# Patient Record
Sex: Female | Born: 1990 | Race: White | Hispanic: No | Marital: Married | Smoking: Former smoker
Health system: Southern US, Community
[De-identification: ages and names within clinical notes are randomized; demographics above are authoritative.]

## PROBLEM LIST (undated history)

## (undated) ENCOUNTER — Inpatient Hospital Stay (HOSPITAL_COMMUNITY): Payer: Self-pay

## (undated) DIAGNOSIS — O24419 Gestational diabetes mellitus in pregnancy, unspecified control: Secondary | ICD-10-CM

## (undated) DIAGNOSIS — T7840XA Allergy, unspecified, initial encounter: Secondary | ICD-10-CM

## (undated) DIAGNOSIS — F419 Anxiety disorder, unspecified: Secondary | ICD-10-CM

## (undated) HISTORY — PX: TONSILECTOMY/ADENOIDECTOMY WITH MYRINGOTOMY: SHX6125

## (undated) HISTORY — DX: Allergy, unspecified, initial encounter: T78.40XA

---
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
## 2012-01-28 ENCOUNTER — Ambulatory Visit (INDEPENDENT_AMBULATORY_CARE_PROVIDER_SITE_OTHER): Payer: BC Managed Care – PPO | Admitting: Internal Medicine

## 2012-01-28 VITALS — BP 122/83 | HR 69 | Temp 98.1°F | Resp 18 | Ht 63.0 in | Wt 140.0 lb

## 2012-01-28 DIAGNOSIS — R059 Cough, unspecified: Secondary | ICD-10-CM

## 2012-01-28 DIAGNOSIS — F419 Anxiety disorder, unspecified: Secondary | ICD-10-CM

## 2012-01-28 DIAGNOSIS — R05 Cough: Secondary | ICD-10-CM

## 2012-01-28 DIAGNOSIS — J029 Acute pharyngitis, unspecified: Secondary | ICD-10-CM

## 2012-01-28 LAB — POCT RAPID STREP A (OFFICE): Rapid Strep A Screen: NEGATIVE

## 2012-01-28 MED ORDER — HYDROCODONE-ACETAMINOPHEN 7.5-500 MG/15ML PO SOLN: 5.0000 mL | Freq: Four times a day (QID) | ORAL | Status: AC | PRN

## 2012-01-28 MED ORDER — AZITHROMYCIN 250 MG PO TABS: ORAL_TABLET | ORAL | Status: AC

## 2012-01-28 NOTE — Progress Notes (Signed)
  Subjective:    Patient ID: Jessica Rhodes, female    DOB: Mar 20, 1991, 21 y.o.   MRN: [id number]  HPI Very ST, cough for 3d No sob, cp   Review of Systems     Objective:   Physical Exam Throat red , no tonsills Lungs clear  Results for orders placed in visit on 01/28/12  POCT RAPID STREP A (OFFICE)      Component Value Range   Rapid Strep A Screen Negative  Negative         Assessment & Plan:  Zpak and lortab elixir

## 2012-01-28 NOTE — Patient Instructions (Signed)

## 2013-01-20 ENCOUNTER — Ambulatory Visit (INDEPENDENT_AMBULATORY_CARE_PROVIDER_SITE_OTHER): Payer: BC Managed Care – PPO | Admitting: Internal Medicine

## 2013-01-20 VITALS — BP 116/80 | HR 80 | Temp 99.9°F | Resp 16 | Ht 63.0 in | Wt 129.0 lb

## 2013-01-20 DIAGNOSIS — J309 Allergic rhinitis, unspecified: Secondary | ICD-10-CM

## 2013-01-20 DIAGNOSIS — R059 Cough, unspecified: Secondary | ICD-10-CM

## 2013-01-20 DIAGNOSIS — J301 Allergic rhinitis due to pollen: Secondary | ICD-10-CM

## 2013-01-20 DIAGNOSIS — R05 Cough: Secondary | ICD-10-CM

## 2013-01-20 DIAGNOSIS — J019 Acute sinusitis, unspecified: Secondary | ICD-10-CM

## 2013-01-20 MED ORDER — FLUTICASONE PROPIONATE 50 MCG/ACT NA SUSP: NASAL | Status: DC

## 2013-01-20 MED ORDER — AMOXICILLIN 875 MG PO TABS: 875.0000 mg | ORAL_TABLET | Freq: Two times a day (BID) | ORAL | Status: DC

## 2013-01-20 MED ORDER — HYDROCODONE-HOMATROPINE 5-1.5 MG/5ML PO SYRP: 5.0000 mL | ORAL_SOLUTION | Freq: Four times a day (QID) | ORAL | Status: DC | PRN

## 2013-01-20 NOTE — Progress Notes (Signed)
  Subjective:    Patient ID: Jessica Rhodes, female    DOB: February 25, 1991, 22 y.o.   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 amoxicillin (AMOXIL) 875 MG tablet    Sig: Take 1 tablet (875 mg total) by mouth 2 (two) times daily.    Dispense:  20 tablet    Refill:  0  . HYDROcodone-homatropine (HYCODAN) 5-1.5 MG/5ML syrup    Sig: Take 5 mLs by mouth every 6 (six) hours as needed for cough.    Dispense:  120 mL    Refill:  0  . fluticasone (FLONASE) 50 MCG/ACT nasal spray    Sig: 1 spr each nostr bid    Dispense:  16 g    Refill:  6

## 2014-09-11 HISTORY — PX: WISDOM TOOTH EXTRACTION: SHX21

## 2016-08-10 ENCOUNTER — Encounter: Payer: Self-pay | Admitting: Family Medicine

## 2016-08-10 ENCOUNTER — Ambulatory Visit (INDEPENDENT_AMBULATORY_CARE_PROVIDER_SITE_OTHER): Payer: PRIVATE HEALTH INSURANCE | Admitting: Family Medicine

## 2016-08-10 VITALS — BP 118/70 | HR 83 | Temp 98.6°F | Resp 18 | Ht 63.0 in | Wt 158.2 lb

## 2016-08-10 DIAGNOSIS — J069 Acute upper respiratory infection, unspecified: Secondary | ICD-10-CM

## 2016-08-10 DIAGNOSIS — J301 Allergic rhinitis due to pollen: Secondary | ICD-10-CM

## 2016-08-10 DIAGNOSIS — R0981 Nasal congestion: Secondary | ICD-10-CM

## 2016-08-10 MED ORDER — FLUTICASONE PROPIONATE 50 MCG/ACT NA SUSP: NASAL | 6 refills | Status: DC

## 2016-08-10 NOTE — Patient Instructions (Addendum)
IF you received an x-ray today, you will receive an invoice from [facility]. Please contact [facility] at [phone] with questions or concerns regarding your invoice.   IF you received labwork today, you will receive an invoice from [facility]. Please contact [facility] at [phone] with questions or concerns regarding your invoice.   Our billing staff will not be able to assist you with questions regarding bills from these companies.  You will be contacted with the lab results as soon as they are available. The fastest way to get your results is to activate your My Chart account. Instructions are located on the last page of this paperwork. If you have not heard from us regarding the results in 2 weeks, please contact this office.     Viral Respiratory Infection Introduction A viral respiratory infection is an illness that affects parts of the body used for breathing, like the lungs, nose, and throat. It is caused by a germ called a virus. Some examples of this kind of infection are:  A cold.  The flu (influenza).  A respiratory syncytial virus (RSV) infection. How do I know if I have this infection? Most of the time this infection causes:  A stuffy or runny nose.  Yellow or green fluid in the nose.  A cough.  Sneezing.  Tiredness (fatigue).  Achy muscles.  A sore throat.  Sweating or chills.  A fever.  A headache. How is this infection treated? If the flu is diagnosed early, it may be treated with an antiviral medicine. This medicine shortens the length of time a person has symptoms. Symptoms may be treated with over-the-counter and prescription medicines, such as:  Expectorants. These make it easier to cough up mucus.  Decongestant nasal sprays. Doctors do not prescribe antibiotic medicines for viral infections. They do not work with this kind of infection. How do I know if I should stay home? To keep  others from getting sick, stay home if you have:  A fever.  A lasting cough.  A sore throat.  A runny nose.  Sneezing.  Muscles aches.  Headaches.  Tiredness.  Weakness.  Chills.  Sweating.  An upset stomach (nausea). Follow these instructions at home:  Rest as much as possible.  Take over-the-counter and prescription medicines only as told by your doctor.  Drink enough fluid to keep your pee (urine) clear or pale yellow.  Gargle with salt water. Do this 3-4 times per day or as needed. To make a salt-water mixture, dissolve -1 tsp of salt in 1 cup of warm water. Make sure the salt dissolves all the way.  Use nose drops made from salt water. This helps with stuffiness (congestion). It also helps soften the skin around your nose.  Do not drink alcohol.  Do not use tobacco products, including cigarettes, chewing tobacco, and e-cigarettes. If you need help quitting, ask your doctor. Get help if:  Your symptoms last for 10 days or longer.  Your symptoms get worse over time.  You have a fever.  You have very bad pain in your face or forehead.  Parts of your jaw or neck become very swollen. Get help right away if:  You feel pain or pressure in your chest.  You have shortness of breath.  You faint or feel like you will faint.  You keep throwing up (vomiting).  You feel confused. This information is not intended to replace advice given to you by your health care provider.  Make sure you discuss any questions you have with your health care provider. Document Released: 08/10/2008 Document Revised: 02/03/2016 Document Reviewed: 02/03/2015  2017 Elsevier

## 2016-08-10 NOTE — Progress Notes (Signed)
  Chief Complaint  Patient presents with  . Nasal Congestion  . 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    Past Medical History:  Diagnosis Date  . Allergy     Current Outpatient Prescriptions  Medication Sig Dispense Refill  . escitalopram (LEXAPRO) 10 MG tablet Take 10 mg by mouth daily.    . fluticasone (FLONASE) 50 MCG/ACT nasal spray 1 spr each nostr bid 16 g 6  . norethindrone-ethinyl estradiol (JUNEL FE,GILDESS FE,LOESTRIN FE) 1-20 MG-MCG tablet Take 1 tablet by mouth daily.     No current facility-administered medications for this visit.     Allergies: No Known Allergies  Past Surgical History:  Procedure Laterality Date  . TONSILECTOMY/ADENOIDECTOMY WITH MYRINGOTOMY      Social History   Social History  . Marital status: Single    Spouse name: N/A  . Number of children: N/A  . Years of education: N/A   Social History Main Topics  . Smoking status: Current Every Day Smoker  . Smokeless tobacco: Never Used  . Alcohol use No  . Drug use: No  . Sexual activity: Yes    Birth control/ protection: Pill   Other Topics Concern  . None   Social History Narrative  . None    ROS  Objective: Vitals:   08/10/16 1030  BP: 118/70  Pulse: 83  Resp: 18  Temp: 98.6 F (37 C)  TempSrc: Oral  SpO2: 98%  Weight: 158 lb 3.2 oz (71.8 kg)  Height: 5\' 3"  (1.6 m)    Physical Exam General: alert, oriented, in NAD Head: normocephalic, atraumatic, no sinus tenderness Eyes: EOM intact, no scleral icterus or  conjunctival injection Ears: TM clear bilaterally Throat: no pharyngeal exudate or erythema Lymph: no posterior auricular, submental or cervical lymph adenopathy Heart: normal rate, normal sinus rhythm, no murmurs Lungs: clear to auscultation bilaterally, no wheezing   Assessment and Plan Jessica Rhodes was seen today for nasal congestion and sore throat.  Diagnoses and all orders for this visit:  Acute URI Nasal congestion Acute seasonal allergic rhinitis due to pollen -     fluticasone (FLONASE) 50 MCG/ACT nasal spray; 1 spr each nostr bid Advised pt to continue rest and hydration Gave work note for today Return to work 08/11/16 Discussed viral syndromes and why antibiotics are not necessary    Jessica Rhodes A [employer]

## 2020-01-21 ENCOUNTER — Ambulatory Visit (INDEPENDENT_AMBULATORY_CARE_PROVIDER_SITE_OTHER): Payer: PRIVATE HEALTH INSURANCE | Admitting: Plastic Surgery

## 2020-01-21 ENCOUNTER — Other Ambulatory Visit: Payer: Self-pay

## 2020-01-21 ENCOUNTER — Encounter: Payer: Self-pay | Admitting: Plastic Surgery

## 2020-01-21 VITALS — BP 118/84 | HR 86 | Temp 97.7°F | Ht 64.0 in | Wt 188.0 lb

## 2020-01-21 DIAGNOSIS — M545 Low back pain, unspecified: Secondary | ICD-10-CM

## 2020-01-21 DIAGNOSIS — N62 Hypertrophy of breast: Secondary | ICD-10-CM | POA: Diagnosis not present

## 2020-01-21 DIAGNOSIS — M4004 Postural kyphosis, thoracic region: Secondary | ICD-10-CM

## 2020-01-21 DIAGNOSIS — M546 Pain in thoracic spine: Secondary | ICD-10-CM

## 2020-01-21 NOTE — Progress Notes (Signed)
   Referring Provider Deatra James, MD [phone] [address] [place],  [place] [address]   CC:  Chief Complaint  Patient presents with  . Consult    Breast reduction      Jessica Rhodes is an 29 y.o. female.  HPI: Patient presents to discuss breast reduction.  She is currently a 30 8G and wants to be around a C cup.  She has had years of back pain, neck pain, shoulder grooving related to her breast.  She also gets rashes beneath the breast intermittently.  She has tried going to a chiropractor, over-the-counter medications, hot packs, ice packs, and over-the-counter creams and powders for the rashes.  She has not had any sustained success with any of these modalities.  She has no previous breast procedures or biopsies.  She has no family history of breast cancer.  She does not smoke.  She has not had any kids and does not have any current plans to have any.  No Known Allergies  Outpatient Encounter Medications as of 01/21/2020  Medication Sig  . escitalopram (LEXAPRO) 10 MG tablet Take 20 mg by mouth daily.   . fluticasone (FLONASE) 50 MCG/ACT nasal spray 1 spr each nostr bid  . norethindrone-ethinyl estradiol (JUNEL FE,GILDESS FE,LOESTRIN FE) 1-20 MG-MCG tablet Take 1 tablet by mouth daily.   No facility-administered encounter medications on file as of 01/21/2020.     Past Medical History:  Diagnosis Date  . Allergy     Past Surgical History:  Procedure Laterality Date  . TONSILECTOMY/ADENOIDECTOMY WITH MYRINGOTOMY      Family History  Problem Relation Age of Onset  . Cancer Paternal Grandmother   . Heart disease Paternal Grandfather   . Diabetes Paternal Grandfather     Social History   Social History Narrative  . Not on file     Review of Systems General: Denies fevers, chills, weight loss CV: Denies chest pain, shortness of breath, palpitations  Physical Exam Vitals with BMI 01/21/2020 08/10/2016 01/20/2013  Height 5\' 4"  5\' 3"  5\' 3"   Weight 188 lbs 158  lbs 3 oz 129 lbs  BMI 32.25 28.1 22.9  Systolic 118 118  Diastolic 84 70 80  Pulse 86 83 80    General:  No acute distress,  Alert and oriented, Non-Toxic, Normal speech and affect Breast: She has grade 3 ptosis.  Sternal notch to nipple is 32 cm bilaterally.  Nipple to fold is 22 cm bilaterally.  I do not see any obvious scars or masses.  Assessment/Plan The patient has bilateral symptomatic macromastia.  She is a good candidate for a breast reduction.  She is interested in pursuing surgical treatment.  The details of breast reduction surgery were discussed.  I explained the procedure in detail along the with the expected scars.  The risks were discussed in detail and include bleeding, infection, damage to surrounding structures, need for additional procedures, nipple loss, change in nipple sensation, persistent pain, contour irregularities and asymmetries.  I explained that breast feeding is often not possible after breast reduction surgery.  We discussed the expected postoperative course with an overall recovery period of about 1 month.  She demonstrated full understanding of all risks.  We discussed her personal risk factors.  I anticipate approximately 700 g of tissue removed from each side.   01/21/2020, 12:47 PM

## 2020-04-15 ENCOUNTER — Ambulatory Visit (INDEPENDENT_AMBULATORY_CARE_PROVIDER_SITE_OTHER): Payer: PRIVATE HEALTH INSURANCE | Admitting: Surgical

## 2020-04-15 ENCOUNTER — Encounter: Payer: Self-pay | Admitting: Surgical

## 2020-04-15 ENCOUNTER — Other Ambulatory Visit: Payer: Self-pay

## 2020-04-15 VITALS — BP 134/83 | HR 81 | Temp 98.4°F | Ht 63.0 in | Wt 198.4 lb

## 2020-04-15 DIAGNOSIS — N62 Hypertrophy of breast: Secondary | ICD-10-CM

## 2020-04-15 DIAGNOSIS — M546 Pain in thoracic spine: Secondary | ICD-10-CM

## 2020-04-15 DIAGNOSIS — M4004 Postural kyphosis, thoracic region: Secondary | ICD-10-CM

## 2020-04-15 DIAGNOSIS — M545 Low back pain, unspecified: Secondary | ICD-10-CM

## 2020-04-15 MED ORDER — ONDANSETRON HCL 4 MG PO TABS: 4.0000 mg | ORAL_TABLET | Freq: Three times a day (TID) | ORAL | 0 refills | Status: DC | PRN

## 2020-04-15 MED ORDER — HYDROCODONE-ACETAMINOPHEN 5-325 MG PO TABS: 1.0000 | ORAL_TABLET | Freq: Four times a day (QID) | ORAL | 0 refills | Status: AC | PRN

## 2020-04-15 NOTE — H&P (View-Only) (Signed)
Patient ID: Jessica Rhodes, female    DOB: 10-03-1990, 29 y.o.   MRN: [id number]  Chief Complaint  Patient presents with  . Pre-op Exam      ICD-10-CM   1. Macromastia  N62   2. Postural kyphosis, thoracic region  M40.04   3. Back pain of thoracolumbar region  M54.5    M54.6      History of Present Illness: Jessica Rhodes is a 29 y.o.  female  with a history of macromastia.  She presents for preoperative evaluation for upcoming procedure, bilateral breast reduction, scheduled for 05/05/2020 with Dr. Arita Miss  The patient has not had problems with anesthesia. She reports only one surgery, tonsillectomy/adenoidectomy at a young age. No fmhx of anesthetic issues of MH. No history of DVT/PE.  No family history of DVT/PE.  No family or personal history of bleeding or clotting disorders.  Patient is not currently taking any blood thinners.  No history of CVA/MI.   Summary of Previous Visit: Patient is currently a 64 G and wants to be around a C cup.  Patient gets rashes beneath her breast intermittently, has had years of back pain, neck pain, shoulder grooving.  She has no family history of breast cancer.  She does not smoke.  Anticipate approximately 700 g of tissue removed from each side.  Job: Chief Financial Officer for [employer].  No significant PMHx.   Past Medical History: Allergies: No Known Allergies  Current Medications:  Current Outpatient Medications:  .  escitalopram (LEXAPRO) 10 MG tablet, Take 20 mg by mouth daily. , Disp: , Rfl:   Past Medical Problems: Past Medical History:  Diagnosis Date  . Allergy     Past Surgical History: Past Surgical History:  Procedure Laterality Date  . TONSILECTOMY/ADENOIDECTOMY WITH MYRINGOTOMY      Social History: Social History   Socioeconomic History  . Marital status: Single    Spouse name: Not on file  . Number of children: Not on file  . Years of education: Not on file  . Highest education level: Not on file    Occupational History  . Not on file  Tobacco Use  . Smoking status: Former Smoker    Types: Cigarettes    Quit date: 09/26/2019    Years since quitting: 0.5  . Smokeless tobacco: Never Used  Substance and Sexual Activity  . Alcohol use: No  . Drug use: No  . Sexual activity: Yes    Birth control/protection: Pill  Other Topics Concern  . Not on file  Social History Narrative  . Not on file   Social Determinants of Health   Financial Resource Strain:   . Difficulty of Paying Living Expenses:   Food Insecurity:   . Worried About Programme researcher, broadcasting/film/video in the Last Year:   . Barista in the Last Year:   Transportation Needs:   . Freight forwarder (Medical):   Marland Kitchen Lack of Transportation (Non-Medical):   Physical Activity:   . Days of Exercise per Week:   . Minutes of Exercise per Session:   Stress:   . Feeling of Stress :   Social Connections:   . Frequency of Communication with Friends and Family:   . Frequency of Social Gatherings with Friends and Family:   . Attends Religious Services:   . Active Member of Clubs or Organizations:   . Attends Banker Meetings:   Marland Kitchen Marital Status:   Intimate Partner Violence:   .  Fear of Current or Ex-Partner:   . Emotionally Abused:   Marland Kitchen Physically Abused:   . Sexually Abused:     Family History: Family History  Problem Relation Age of Onset  . Cancer Paternal Grandmother   . Heart disease Paternal Grandfather   . Diabetes Paternal Grandfather     Review of Systems: Review of Systems  Constitutional: Negative.   Respiratory: Negative.   Cardiovascular: Negative.   Gastrointestinal: Negative.     Physical Exam: Vital Signs BP 134/83 (BP Location: Left Arm, Patient Position: Sitting, Cuff Size: Normal)   Pulse 81   Temp 98.4 F (36.9 C) (Oral)   Ht 5\' 3"  (1.6 m)   Wt 198 lb 6.4 oz (90 kg)   LMP 03/25/2020 (Exact Date)   SpO2 96%   BMI 35.14 kg/m  Physical Exam Exam conducted with a chaperone  present.  Constitutional:      General: She is not in acute distress.    Appearance: Normal appearance. She is not ill-appearing.  HENT:     Head: Normocephalic and atraumatic.  Eyes:     Pupils: Pupils are equal, round Neck:     Musculoskeletal: Normal range of motion.  Cardiovascular:     Rate and Rhythm: Normal rate and regular rhythm.     Pulses: Normal pulses.     Heart sounds: Normal heart sounds. No murmur.  Pulmonary:     Effort: Pulmonary effort is normal. No respiratory distress.     Breath sounds: Normal breath sounds. No wheezing.  Abdominal:     General: Abdomen is flat. There is no distension.     Palpations: Abdomen is soft.     Tenderness: There is no abdominal tenderness.  Musculoskeletal: Normal range of motion.  Skin:    General: Skin is warm and dry.     Findings: No erythema or rash.  Neurological:     General: No focal deficit present.     Mental Status: Mental status is at baseline.     Motor: No weakness.  Psychiatric:        Mood and Affect: Mood normal.        Behavior: Behavior normal.     Assessment/Plan: Patient is scheduled for bilateral breast reduction with Dr. 03/27/2020.  Risks, benefits, and alternatives of procedure discussed, questions answered and consent obtained.    Smoking Status: non smoker; Counseling Given? NA Last Mammogram: None; Results: NA. No fmhx or pmhx of breast CA  Caprini Score: 3; Risk Factors include: BMI > 25, and length of planned surgery. Recommendation for mechanical prophylaxis during surgery. Encourage early ambulation.   Pictures obtained: 01/21/20  Post-op Rx sent to pharmacy: norco, zofran  Patient was provided with the breast reduction and General Surgical Risk consent document and Pain Medication Agreement prior to their appointment.  They had adequate time to read through the risk consent documents and Pain Medication Agreement. We also discussed them in person together during this preop appointment. All of  their questions were answered to their satisfaction.  Recommended calling if they have any further questions.  Risk consent form and Pain Medication Agreement to be scanned into patient's chart.  The risk that can be encountered with breast reduction were discussed and include the following but not limited to these:  Breast asymmetry, fluid accumulation, firmness of the breast, inability to breast feed, loss of nipple or areola, skin loss, decrease or no nipple sensation, fat necrosis of the breast tissue, bleeding, infection, healing delay.  There  are risks of anesthesia, changes to skin sensation and injury to nerves or blood vessels.  The muscle can be temporarily or permanently injured.  You may have an allergic reaction to tape, suture, glue, blood products which can result in skin discoloration, swelling, pain, skin lesions, poor healing.  Any of these can lead to the need for revisonal surgery or stage procedures.  A reduction has potential to interfere with diagnostic procedures.  Nipple or breast piercing can increase risks of infection.  This procedure is best done when the breast is fully developed.  Changes in the breast will continue to occur over time.  Pregnancy can alter the outcomes of previous breast reduction surgery, weight gain and weigh loss can also effect the long term appearance.     Electronically signed by: Diondre Pulis J Miana Politte, PA-C 04/15/2020 8:34 AM 

## 2020-04-15 NOTE — Progress Notes (Signed)
Patient ID: Jessica Rhodes, female    DOB: 10-03-1990, 29 y.o.   MRN: [id number]  Chief Complaint  Patient presents with  . Pre-op Exam      ICD-10-CM   1. Macromastia  N62   2. Postural kyphosis, thoracic region  M40.04   3. Back pain of thoracolumbar region  M54.5    M54.6      History of Present Illness: Jessica Rhodes is a 29 y.o.  female  with a history of macromastia.  She presents for preoperative evaluation for upcoming procedure, bilateral breast reduction, scheduled for 05/05/2020 with Dr. Arita Miss  The patient has not had problems with anesthesia. She reports only one surgery, tonsillectomy/adenoidectomy at a young age. No fmhx of anesthetic issues of MH. No history of DVT/PE.  No family history of DVT/PE.  No family or personal history of bleeding or clotting disorders.  Patient is not currently taking any blood thinners.  No history of CVA/MI.   Summary of Previous Visit: Patient is currently a 63 G and wants to be around a C cup.  Patient gets rashes beneath her breast intermittently, has had years of back pain, neck pain, shoulder grooving.  She has no family history of breast cancer.  She does not smoke.  Anticipate approximately 700 g of tissue removed from each side.  Job: Chief Financial Officer for [employer].  No significant PMHx.   Past Medical History: Allergies: No Known Allergies  Current Medications:  Current Outpatient Medications:  .  escitalopram (LEXAPRO) 10 MG tablet, Take 20 mg by mouth daily. , Disp: , Rfl:   Past Medical Problems: Past Medical History:  Diagnosis Date  . Allergy     Past Surgical History: Past Surgical History:  Procedure Laterality Date  . TONSILECTOMY/ADENOIDECTOMY WITH MYRINGOTOMY      Social History: Social History   Socioeconomic History  . Marital status: Single    Spouse name: Not on file  . Number of children: Not on file  . Years of education: Not on file  . Highest education level: Not on file    Occupational History  . Not on file  Tobacco Use  . Smoking status: Former Smoker    Types: Cigarettes    Quit date: 09/26/2019    Years since quitting: 0.5  . Smokeless tobacco: Never Used  Substance and Sexual Activity  . Alcohol use: No  . Drug use: No  . Sexual activity: Yes    Birth control/protection: Pill  Other Topics Concern  . Not on file  Social History Narrative  . Not on file   Social Determinants of Health   Financial Resource Strain:   . Difficulty of Paying Living Expenses:   Food Insecurity:   . Worried About Programme researcher, broadcasting/film/video in the Last Year:   . Barista in the Last Year:   Transportation Needs:   . Freight forwarder (Medical):   Marland Kitchen Lack of Transportation (Non-Medical):   Physical Activity:   . Days of Exercise per Week:   . Minutes of Exercise per Session:   Stress:   . Feeling of Stress :   Social Connections:   . Frequency of Communication with Friends and Family:   . Frequency of Social Gatherings with Friends and Family:   . Attends Religious Services:   . Active Member of Clubs or Organizations:   . Attends Banker Meetings:   Marland Kitchen Marital Status:   Intimate Partner Violence:   .  Fear of Current or Ex-Partner:   . Emotionally Abused:   Marland Kitchen Physically Abused:   . Sexually Abused:     Family History: Family History  Problem Relation Age of Onset  . Cancer Paternal Grandmother   . Heart disease Paternal Grandfather   . Diabetes Paternal Grandfather     Review of Systems: Review of Systems  Constitutional: Negative.   Respiratory: Negative.   Cardiovascular: Negative.   Gastrointestinal: Negative.     Physical Exam: Vital Signs BP 134/83 (BP Location: Left Arm, Patient Position: Sitting, Cuff Size: Normal)   Pulse 81   Temp 98.4 F (36.9 C) (Oral)   Ht 5\' 3"  (1.6 m)   Wt 198 lb 6.4 oz (90 kg)   LMP 03/25/2020 (Exact Date)   SpO2 96%   BMI 35.14 kg/m  Physical Exam Exam conducted with a chaperone  present.  Constitutional:      General: She is not in acute distress.    Appearance: Normal appearance. She is not ill-appearing.  HENT:     Head: Normocephalic and atraumatic.  Eyes:     Pupils: Pupils are equal, round Neck:     Musculoskeletal: Normal range of motion.  Cardiovascular:     Rate and Rhythm: Normal rate and regular rhythm.     Pulses: Normal pulses.     Heart sounds: Normal heart sounds. No murmur.  Pulmonary:     Effort: Pulmonary effort is normal. No respiratory distress.     Breath sounds: Normal breath sounds. No wheezing.  Abdominal:     General: Abdomen is flat. There is no distension.     Palpations: Abdomen is soft.     Tenderness: There is no abdominal tenderness.  Musculoskeletal: Normal range of motion.  Skin:    General: Skin is warm and dry.     Findings: No erythema or rash.  Neurological:     General: No focal deficit present.     Mental Status: Mental status is at baseline.     Motor: No weakness.  Psychiatric:        Mood and Affect: Mood normal.        Behavior: Behavior normal.     Assessment/Plan: Patient is scheduled for bilateral breast reduction with Dr. 03/27/2020.  Risks, benefits, and alternatives of procedure discussed, questions answered and consent obtained.    Smoking Status: non smoker; Counseling Given? NA Last Mammogram: None; Results: NA. No fmhx or pmhx of breast CA  Caprini Score: 3; Risk Factors include: BMI > 25, and length of planned surgery. Recommendation for mechanical prophylaxis during surgery. Encourage early ambulation.   Pictures obtained: 01/21/20  Post-op Rx sent to pharmacy: norco, zofran  Patient was provided with the breast reduction and General Surgical Risk consent document and Pain Medication Agreement prior to their appointment.  They had adequate time to read through the risk consent documents and Pain Medication Agreement. We also discussed them in person together during this preop appointment. All of  their questions were answered to their satisfaction.  Recommended calling if they have any further questions.  Risk consent form and Pain Medication Agreement to be scanned into patient's chart.  The risk that can be encountered with breast reduction were discussed and include the following but not limited to these:  Breast asymmetry, fluid accumulation, firmness of the breast, inability to breast feed, loss of nipple or areola, skin loss, decrease or no nipple sensation, fat necrosis of the breast tissue, bleeding, infection, healing delay.  There  are risks of anesthesia, changes to skin sensation and injury to nerves or blood vessels.  The muscle can be temporarily or permanently injured.  You may have an allergic reaction to tape, suture, glue, blood products which can result in skin discoloration, swelling, pain, skin lesions, poor healing.  Any of these can lead to the need for revisonal surgery or stage procedures.  A reduction has potential to interfere with diagnostic procedures.  Nipple or breast piercing can increase risks of infection.  This procedure is best done when the breast is fully developed.  Changes in the breast will continue to occur over time.  Pregnancy can alter the outcomes of previous breast reduction surgery, weight gain and weigh loss can also effect the long term appearance.     Electronically signed by: Kermit Balo Martinez Boxx, PA-C 04/15/2020 8:34 AM

## 2020-04-28 ENCOUNTER — Encounter (HOSPITAL_BASED_OUTPATIENT_CLINIC_OR_DEPARTMENT_OTHER): Payer: Self-pay | Admitting: Plastic Surgery

## 2020-04-28 ENCOUNTER — Other Ambulatory Visit: Payer: Self-pay

## 2020-05-03 ENCOUNTER — Other Ambulatory Visit (HOSPITAL_COMMUNITY)
Admission: RE | Admit: 2020-05-03 | Discharge: 2020-05-03 | Disposition: A | Discharge status: Home / Self Care | Payer: PRIVATE HEALTH INSURANCE | Source: Ambulatory Visit | Attending: Plastic Surgery | Admitting: Plastic Surgery

## 2020-05-03 DIAGNOSIS — Z01812 Encounter for preprocedural laboratory examination: Secondary | ICD-10-CM | POA: Diagnosis not present

## 2020-05-03 DIAGNOSIS — Z20822 Contact with and (suspected) exposure to covid-19: Secondary | ICD-10-CM | POA: Diagnosis not present

## 2020-05-03 LAB — SARS CORONAVIRUS 2 (TAT 6-24 HRS): SARS Coronavirus 2: NEGATIVE

## 2020-05-04 NOTE — Anesthesia Preprocedure Evaluation (Addendum)
Anesthesia Evaluation  Patient identified by MRN, date of birth, ID band Patient awake    Reviewed: Allergy & Precautions, H&P , NPO status , Patient's Chart, lab work & pertinent test results  Airway Mallampati: II  TM Distance: >3 FB Neck ROM: Full    Dental no notable dental hx. (+) Teeth Intact, Dental Advisory Given   Pulmonary neg pulmonary ROS, former smoker,    Pulmonary exam normal breath sounds clear to auscultation       Cardiovascular Exercise Tolerance: Good negative cardio ROS Normal cardiovascular exam Rhythm:Regular Rate:Normal     Neuro/Psych Anxiety negative neurological ROS     GI/Hepatic negative GI ROS, Neg liver ROS,   Endo/Other  negative endocrine ROS  Renal/GU negative Renal ROS  negative genitourinary   Musculoskeletal negative musculoskeletal ROS (+)   Abdominal   Peds negative pediatric ROS (+)  Hematology negative hematology ROS (+)   Anesthesia Other Findings   Reproductive/Obstetrics negative OB ROS                            Anesthesia Physical Anesthesia Plan  ASA: I  Anesthesia Plan: General   Post-op Pain Management:    Induction: Intravenous  PONV Risk Score and Plan: 3 and Dexamethasone and Ondansetron  Airway Management Planned: Oral ETT and LMA  Additional Equipment:   Intra-op Plan:   Post-operative Plan: Extubation in OR  Informed Consent: I have reviewed the patients History and Physical, chart, labs and discussed the procedure including the risks, benefits and alternatives for the proposed anesthesia with the patient or authorized representative who has indicated his/her understanding and acceptance.       Plan Discussed with: Anesthesiologist and CRNA  Anesthesia Plan Comments: (  )        Anesthesia Quick Evaluation

## 2020-05-05 ENCOUNTER — Ambulatory Visit (HOSPITAL_BASED_OUTPATIENT_CLINIC_OR_DEPARTMENT_OTHER)
Admission: RE | Admit: 2020-05-05 | Discharge: 2020-05-05 | Disposition: A | Discharge status: Home / Self Care | Payer: PRIVATE HEALTH INSURANCE | Attending: Plastic Surgery | Admitting: Plastic Surgery

## 2020-05-05 ENCOUNTER — Encounter (HOSPITAL_BASED_OUTPATIENT_CLINIC_OR_DEPARTMENT_OTHER): Payer: Self-pay | Admitting: Plastic Surgery

## 2020-05-05 ENCOUNTER — Encounter (HOSPITAL_BASED_OUTPATIENT_CLINIC_OR_DEPARTMENT_OTHER)
Admission: RE | Disposition: A | Discharge status: Home / Self Care | Payer: Self-pay | Source: Home / Self Care | Attending: Plastic Surgery

## 2020-05-05 ENCOUNTER — Ambulatory Visit (HOSPITAL_BASED_OUTPATIENT_CLINIC_OR_DEPARTMENT_OTHER): Payer: PRIVATE HEALTH INSURANCE | Admitting: Anesthesiology

## 2020-05-05 ENCOUNTER — Other Ambulatory Visit: Payer: Self-pay

## 2020-05-05 DIAGNOSIS — M545 Low back pain: Secondary | ICD-10-CM

## 2020-05-05 DIAGNOSIS — M4004 Postural kyphosis, thoracic region: Secondary | ICD-10-CM | POA: Insufficient documentation

## 2020-05-05 DIAGNOSIS — N62 Hypertrophy of breast: Secondary | ICD-10-CM | POA: Insufficient documentation

## 2020-05-05 DIAGNOSIS — F419 Anxiety disorder, unspecified: Secondary | ICD-10-CM | POA: Diagnosis not present

## 2020-05-05 DIAGNOSIS — M549 Dorsalgia, unspecified: Secondary | ICD-10-CM | POA: Insufficient documentation

## 2020-05-05 DIAGNOSIS — Z87891 Personal history of nicotine dependence: Secondary | ICD-10-CM | POA: Insufficient documentation

## 2020-05-05 DIAGNOSIS — Z79899 Other long term (current) drug therapy: Secondary | ICD-10-CM | POA: Diagnosis not present

## 2020-05-05 DIAGNOSIS — M546 Pain in thoracic spine: Secondary | ICD-10-CM

## 2020-05-05 HISTORY — DX: Anxiety disorder, unspecified: F41.9

## 2020-05-05 HISTORY — PX: BREAST REDUCTION SURGERY: SHX8

## 2020-05-05 SURGERY — MAMMOPLASTY, REDUCTION
Anesthesia: General | Site: Breast | Laterality: Bilateral

## 2020-05-05 MED ORDER — ROCURONIUM BROMIDE 100 MG/10ML IV SOLN
INTRAVENOUS | Status: DC | PRN
  Administered 2020-05-05: 60 mg via INTRAVENOUS

## 2020-05-05 MED ORDER — MIDAZOLAM HCL 2 MG/2ML IJ SOLN
INTRAMUSCULAR | Status: AC
  Filled 2020-05-05: qty 2

## 2020-05-05 MED ORDER — FENTANYL CITRATE (PF) 100 MCG/2ML IJ SOLN
INTRAMUSCULAR | Status: DC | PRN
Start: 2020-05-05 — End: 2020-05-05
  Administered 2020-05-05: 100 ug via INTRAVENOUS
  Administered 2020-05-05 (×2): 50 ug via INTRAVENOUS

## 2020-05-05 MED ORDER — FENTANYL CITRATE (PF) 100 MCG/2ML IJ SOLN
INTRAMUSCULAR | Status: AC
  Filled 2020-05-05: qty 2

## 2020-05-05 MED ORDER — MEPERIDINE HCL 25 MG/ML IJ SOLN: 6.2500 mg | INTRAMUSCULAR | Status: DC | PRN

## 2020-05-05 MED ORDER — CEFAZOLIN SODIUM-DEXTROSE 2-4 GM/100ML-% IV SOLN
INTRAVENOUS | Status: AC
  Filled 2020-05-05: qty 100

## 2020-05-05 MED ORDER — PROPOFOL 10 MG/ML IV BOLUS
INTRAVENOUS | Status: DC | PRN
  Administered 2020-05-05: 200 mg via INTRAVENOUS

## 2020-05-05 MED ORDER — ONDANSETRON HCL 4 MG/2ML IJ SOLN
INTRAMUSCULAR | Status: AC
  Filled 2020-05-05: qty 8

## 2020-05-05 MED ORDER — ACETAMINOPHEN 160 MG/5ML PO SOLN: 325.0000 mg | ORAL | Status: DC | PRN

## 2020-05-05 MED ORDER — ACETAMINOPHEN 325 MG PO TABS: 325.0000 mg | ORAL_TABLET | ORAL | Status: DC | PRN

## 2020-05-05 MED ORDER — FENTANYL CITRATE (PF) 100 MCG/2ML IJ SOLN
25.0000 ug | INTRAMUSCULAR | Status: DC | PRN
  Administered 2020-05-05 (×2): 50 ug via INTRAVENOUS

## 2020-05-05 MED ORDER — SUGAMMADEX SODIUM 500 MG/5ML IV SOLN
INTRAVENOUS | Status: AC
  Filled 2020-05-05: qty 5

## 2020-05-05 MED ORDER — PROPOFOL 10 MG/ML IV BOLUS
INTRAVENOUS | Status: AC
  Filled 2020-05-05: qty 20

## 2020-05-05 MED ORDER — LACTATED RINGERS IV SOLN
INTRAVENOUS | Status: DC | PRN
  Administered 2020-05-05: 1000 mL

## 2020-05-05 MED ORDER — CEFAZOLIN SODIUM-DEXTROSE 2-4 GM/100ML-% IV SOLN
2.0000 g | INTRAVENOUS | Status: AC
  Administered 2020-05-05: 2 g via INTRAVENOUS

## 2020-05-05 MED ORDER — OXYCODONE HCL 5 MG/5ML PO SOLN: 5.0000 mg | Freq: Once | ORAL | Status: AC | PRN

## 2020-05-05 MED ORDER — DEXAMETHASONE SODIUM PHOSPHATE 4 MG/ML IJ SOLN
INTRAMUSCULAR | Status: DC | PRN
  Administered 2020-05-05: 10 mg via INTRAVENOUS

## 2020-05-05 MED ORDER — ROCURONIUM BROMIDE 10 MG/ML (PF) SYRINGE
PREFILLED_SYRINGE | INTRAVENOUS | Status: AC
  Filled 2020-05-05: qty 20

## 2020-05-05 MED ORDER — MIDAZOLAM HCL 5 MG/5ML IJ SOLN
INTRAMUSCULAR | Status: DC | PRN
  Administered 2020-05-05: 2 mg via INTRAVENOUS

## 2020-05-05 MED ORDER — LIDOCAINE 2% (20 MG/ML) 5 ML SYRINGE
INTRAMUSCULAR | Status: AC
  Filled 2020-05-05: qty 15

## 2020-05-05 MED ORDER — SUGAMMADEX SODIUM 200 MG/2ML IV SOLN
INTRAVENOUS | Status: DC | PRN
  Administered 2020-05-05: 200 mg via INTRAVENOUS

## 2020-05-05 MED ORDER — ONDANSETRON HCL 4 MG/2ML IJ SOLN: 4.0000 mg | Freq: Once | INTRAMUSCULAR | Status: DC | PRN

## 2020-05-05 MED ORDER — PROPOFOL 500 MG/50ML IV EMUL
INTRAVENOUS | Status: DC | PRN
  Administered 2020-05-05: 25 ug/kg/min via INTRAVENOUS

## 2020-05-05 MED ORDER — OXYCODONE HCL 5 MG PO TABS
5.0000 mg | ORAL_TABLET | Freq: Once | ORAL | Status: AC | PRN
  Administered 2020-05-05: 5 mg via ORAL

## 2020-05-05 MED ORDER — DEXAMETHASONE SODIUM PHOSPHATE 10 MG/ML IJ SOLN
INTRAMUSCULAR | Status: AC
  Filled 2020-05-05: qty 1

## 2020-05-05 MED ORDER — OXYCODONE HCL 5 MG PO TABS
ORAL_TABLET | ORAL | Status: AC
  Filled 2020-05-05: qty 1

## 2020-05-05 MED ORDER — LACTATED RINGERS IV SOLN: INTRAVENOUS | Status: DC

## 2020-05-05 MED ORDER — ONDANSETRON HCL 4 MG/2ML IJ SOLN
INTRAMUSCULAR | Status: DC | PRN
  Administered 2020-05-05: 4 mg via INTRAVENOUS

## 2020-05-05 MED ORDER — LIDOCAINE HCL (CARDIAC) PF 100 MG/5ML IV SOSY
PREFILLED_SYRINGE | INTRAVENOUS | Status: DC | PRN
  Administered 2020-05-05: 60 mg via INTRAVENOUS

## 2020-05-05 SURGICAL SUPPLY — 74 items
APL PRP STRL LF DISP 70% ISPRP (MISCELLANEOUS) ×2
APL SKNCLS STERI-STRIP NONHPOA (GAUZE/BANDAGES/DRESSINGS) ×2
BAG DECANTER FOR FLEXI CONT (MISCELLANEOUS) ×3 IMPLANT
BENZOIN TINCTURE PRP APPL 2/3 (GAUZE/BANDAGES/DRESSINGS) ×6 IMPLANT
BLADE SURG 10 STRL SS (BLADE) ×6 IMPLANT
BLADE SURG 15 STRL LF DISP TIS (BLADE) IMPLANT
BLADE SURG 15 STRL SS (BLADE)
BNDG ELASTIC 6X5.8 VLCR STR LF (GAUZE/BANDAGES/DRESSINGS) ×3 IMPLANT
BNDG GAUZE ELAST 4 BULKY (GAUZE/BANDAGES/DRESSINGS) ×6 IMPLANT
CANISTER SUCT 1200ML W/VALVE (MISCELLANEOUS) ×3 IMPLANT
CHLORAPREP W/TINT 26 (MISCELLANEOUS) ×6 IMPLANT
CLIP VESOCCLUDE MED 6/CT (CLIP) IMPLANT
COVER BACK TABLE 60X90IN (DRAPES) ×3 IMPLANT
COVER MAYO STAND STRL (DRAPES) ×3 IMPLANT
COVER WAND RF STERILE (DRAPES) IMPLANT
DECANTER SPIKE VIAL GLASS SM (MISCELLANEOUS) IMPLANT
DRAIN CHANNEL 15F RND FF W/TCR (WOUND CARE) IMPLANT
DRAPE LAPAROSCOPIC ABDOMINAL (DRAPES) ×3 IMPLANT
DRAPE UTILITY XL STRL (DRAPES) ×3 IMPLANT
DRSG PAD ABDOMINAL 8X10 ST (GAUZE/BANDAGES/DRESSINGS) ×6 IMPLANT
ELECT REM PT RETURN 9FT ADLT (ELECTROSURGICAL) ×3
ELECTRODE REM PT RTRN 9FT ADLT (ELECTROSURGICAL) ×1 IMPLANT
EVACUATOR SILICONE 100CC (DRAIN) IMPLANT
GAUZE SPONGE 4X4 12PLY STRL (GAUZE/BANDAGES/DRESSINGS) ×6 IMPLANT
GAUZE XEROFORM 5X9 LF (GAUZE/BANDAGES/DRESSINGS) IMPLANT
GLOVE BIO SURGEON STRL SZ 6.5 (GLOVE) IMPLANT
GLOVE BIO SURGEON STRL SZ7.5 (GLOVE) ×4 IMPLANT
GLOVE BIO SURGEONS STRL SZ 6.5 (GLOVE)
GLOVE BIOGEL M STRL SZ7.5 (GLOVE) ×5 IMPLANT
GLOVE BIOGEL PI IND STRL 8 (GLOVE) IMPLANT
GLOVE BIOGEL PI INDICATOR 8 (GLOVE) ×2
GLOVE ECLIPSE 6.5 STRL STRAW (GLOVE) IMPLANT
GOWN STRL REUS W/ TWL LRG LVL3 (GOWN DISPOSABLE) ×2 IMPLANT
GOWN STRL REUS W/TWL LRG LVL3 (GOWN DISPOSABLE) ×12
MARKER SKIN DUAL TIP RULER LAB (MISCELLANEOUS) IMPLANT
NDL FILTER BLUNT 18X1 1/2 (NEEDLE) ×1 IMPLANT
NDL HYPO 25X1 1.5 SAFETY (NEEDLE) IMPLANT
NDL SAFETY ECLIPSE 18X1.5 (NEEDLE) ×1 IMPLANT
NDL SPNL 18GX3.5 QUINCKE PK (NEEDLE) ×1 IMPLANT
NEEDLE FILTER BLUNT 18X 1/2SAF (NEEDLE) ×2
NEEDLE FILTER BLUNT 18X1 1/2 (NEEDLE) ×1 IMPLANT
NEEDLE HYPO 18GX1.5 SHARP (NEEDLE) ×3
NEEDLE HYPO 25X1 1.5 SAFETY (NEEDLE) IMPLANT
NEEDLE SPNL 18GX3.5 QUINCKE PK (NEEDLE) ×3 IMPLANT
NS IRRIG 1000ML POUR BTL (IV SOLUTION) ×3 IMPLANT
PACK BASIN DAY SURGERY FS (CUSTOM PROCEDURE TRAY) ×3 IMPLANT
PENCIL SMOKE EVACUATOR (MISCELLANEOUS) ×3 IMPLANT
PIN SAFETY STERILE (MISCELLANEOUS) IMPLANT
SHEET MEDIUM DRAPE 40X70 STRL (DRAPES) IMPLANT
SLEEVE SCD COMPRESS KNEE MED (MISCELLANEOUS) ×3 IMPLANT
SPONGE LAP 18X18 RF (DISPOSABLE) ×9 IMPLANT
STAPLER INSORB 30 2030 C-SECTI (MISCELLANEOUS) ×5 IMPLANT
STAPLER VISISTAT 35W (STAPLE) ×5 IMPLANT
STRIP SUTURE WOUND CLOSURE 1/2 (MISCELLANEOUS) ×9 IMPLANT
SUT CHROMIC 4 0 PS 2 18 (SUTURE) IMPLANT
SUT ETHILON 2 0 FS 18 (SUTURE) IMPLANT
SUT ETHILON 3 0 PS 1 (SUTURE) IMPLANT
SUT MNCRL AB 4-0 PS2 18 (SUTURE) ×6 IMPLANT
SUT PDS 3-0 CT2 (SUTURE) ×12
SUT PDS II 3-0 CT2 27 ABS (SUTURE) ×2 IMPLANT
SUT VIC AB 3-0 PS1 18 (SUTURE)
SUT VIC AB 3-0 PS1 18XBRD (SUTURE) IMPLANT
SUT VLOC 90 P-14 23 (SUTURE) ×6 IMPLANT
SYR 50ML LL SCALE MARK (SYRINGE) ×6 IMPLANT
SYR BULB IRRIG 60ML STRL (SYRINGE) ×3 IMPLANT
SYR CONTROL 10ML LL (SYRINGE) IMPLANT
TAPE MEASURE VINYL STERILE (MISCELLANEOUS) IMPLANT
TOWEL GREEN STERILE FF (TOWEL DISPOSABLE) ×6 IMPLANT
TRAY FOLEY W/BAG SLVR 14FR LF (SET/KITS/TRAYS/PACK) IMPLANT
TUBE CONNECTING 20'X1/4 (TUBING) ×1
TUBE CONNECTING 20X1/4 (TUBING) ×2 IMPLANT
TUBING INFILTRATION IT-10001 (TUBING) ×1 IMPLANT
UNDERPAD 30X36 HEAVY ABSORB (UNDERPADS AND DIAPERS) ×6 IMPLANT
YANKAUER SUCT BULB TIP NO VENT (SUCTIONS) ×3 IMPLANT

## 2020-05-05 NOTE — Transfer of Care (Signed)
Immediate Anesthesia Transfer of Care Note  Patient: Jessica Rhodes  Procedure(s) Performed: MAMMARY REDUCTION  (BREAST) (Bilateral Breast)  Patient Location: PACU  Anesthesia Type:General  Level of Consciousness: awake, alert , oriented and patient cooperative  Airway & Oxygen Therapy: Patient Spontanous Breathing and Patient connected to face mask oxygen  Post-op Assessment: Report given to RN and Post -op Vital signs reviewed and stable  Post vital signs: Reviewed and stable  Last Vitals:  Vitals Value Taken Time  BP 131/77 05/05/20 0957  Temp    Pulse 108 05/05/20 1000  Resp 17 05/05/20 1000  SpO2 99 % 05/05/20 1000  Vitals shown include unvalidated device data.  Last Pain:  Vitals:   05/05/20 8270  TempSrc: Oral  PainSc: 0-No pain         Complications: No complications documented.

## 2020-05-05 NOTE — Anesthesia Postprocedure Evaluation (Signed)
Anesthesia Post Note  Patient: Makana L Gomez  Procedure(s) Performed: MAMMARY REDUCTION  (BREAST) (Bilateral Breast)     Patient location during evaluation: PACU Anesthesia Type: General Level of consciousness: awake and alert Pain management: pain level controlled Vital Signs Assessment: post-procedure vital signs reviewed and stable Respiratory status: spontaneous breathing, nonlabored ventilation, respiratory function stable and patient connected to nasal cannula oxygen Cardiovascular status: blood pressure returned to baseline and stable Postop Assessment: no apparent nausea or vomiting Anesthetic complications: no   No complications documented.  Last Vitals:  Vitals:   05/05/20 1100 05/05/20 1140  BP: 113/68 128/84  Pulse: 82 76  Resp: 14 16  Temp:  37.6 C  SpO2: 95% 95%    Last Pain:  Vitals:   05/05/20 1140  TempSrc:   PainSc: 5                  Woodrow Dulski

## 2020-05-05 NOTE — Op Note (Signed)
Operative Note   DATE OF OPERATION: 05/05/2020  LOCATION: [facility]   SURGICAL DEPARTMENT: Plastic Surgery  PREOPERATIVE DIAGNOSES: Bilateral symptomatic macromastia.  POSTOPERATIVE DIAGNOSES:  same  PROCEDURE: Bilateral breast reduction with superomedial pedicle.  SURGEON: Ancil Linsey, MD  ASSISTANT: Zadie Cleverly, PA The advanced practice practitioner (APP) assisted throughout the case.  The APP was essential in retraction and counter traction when needed to make the case progress smoothly.  This retraction and assistance made it possible to see the tissue plans for the procedure.  The assistance was needed for blood control, tissue re-approximation and assisted with closure of the incision site.  ANESTHESIA: General.  COMPLICATIONS: None.   INDICATIONS FOR PROCEDURE:  The patient, Jessica Rhodes is a 29 y.o. female born on Feb 01, 1991, is here for treatment of bilateral symptomatic macromastia. MRN: [id number]  CONSENT:  Informed consent was obtained directly from the patient. Risks, benefits and alternatives were fully discussed. Specific risks including but not limited to bleeding, infection, hematoma, seroma, scarring, pain, infection, contracture, asymmetry, wound healing problems, and need for further surgery were all discussed. The patient did have an ample opportunity to have questions answered to satisfaction.   DESCRIPTION OF PROCEDURE:  The patient was marked preoperatively for a Wise pattern skin excision.  The patient was taken to the operating room. SCDs were placed and antibiotics were given. General anesthesia was administered.The patient's operative site was prepped and draped in a sterile fashion. A time out was performed and all information was confirmed to be correct.  Right Breast: The breast was infiltrated with tumescent solution to help with hemostasis.  The nipple was marked with a cookie cutter.  A superomedial pedicle was drawn out  with the base of at least 8 cm in size.  A breast tourniquet was then applied and the pedicle was de-epithelialized.  Breast tourniquet was then let down and all incisions were made with a 10 blade.  The pedicle was then isolated down to the chest wall with cautery and the excision was performed removing tissue primarily inferiorly and laterally.  Hemostasis was obtained and the wound was stapled closed.  Left breast:  The breast was infiltrated with tumescent solution to help with hemostasis.  The nipple was marked with a cookie cutter.  A superomedial pedicle was drawn out with the base of at least 8 cm in size.  A breast tourniquet was then applied and the pedicle was de-epithelialized.  Breast tourniquet was then let down and all incisions were made with a 10 blade.  The pedicle was then isolated down to the chest wall with cautery and the excision was performed removing tissue primarily inferiorly and laterally.  Hemostasis was obtained and the wound was stapled closed.  Patient was then set up to check for size and symmetry.  Minor modifications were made.  This resulted in a total of 1266 g removed from the right side and 1148 g removed from the left side.  The inframammary incision was closed with a combination of buried in-sorb staples and a running 3-0 Quill suture.  The vertical and periareolar limbs were closed with interrupted buried 4-0 Monocryl and a running 4-0 Quill suture.  Steri-Strips were then applied along with a soft dressing and Ace wrap.  The patient tolerated the procedure well.  There were no complications. The patient was allowed to wake from anesthesia, extubated and taken to the recovery room in satisfactory condition.  I was present for the entire procedure.

## 2020-05-05 NOTE — Interval H&P Note (Signed)
History and Physical Interval Note:  05/05/2020 7:35 AM  Jessica Rhodes  has presented today for surgery, with the diagnosis of macromastia.  The various methods of treatment have been discussed with the patient and family. After consideration of risks, benefits and other options for treatment, the patient has consented to  Procedure(s): MAMMARY REDUCTION  (BREAST) (Bilateral) as a surgical intervention.  The patient's history has been reviewed, patient examined, no change in status, stable for surgery.  I have reviewed the patient's chart and labs.  Questions were answered to the patient's satisfaction.     Allena Napoleon

## 2020-05-05 NOTE — Discharge Instructions (Addendum)
Activity As tolerated: NO showers for 3 days. Keep ACE wrap on breasts until then. After showering, put ACE wrap back on, this is important for compression. NO driving while in pain, taking pain medication or if you are unable to safely react to traffic. No heavy activities Take Pain medication (Norco) as needed for severe pain. Otherwise, you can use ibuprofen or tylenol as needed. Avoid more than 3,000 mg of tylenol in 24 hours. Norco has 325mg of tylenol per dose.  Diet: Regular. Drink plenty of fluids and eat healthy, high protein, low carbs.  Wound Care: Keep dressing clean & dry. You may change bandages after showering if you continue to notice some drainage. You can reuse bandages if they are not dirty/soiled.  Special Instructions: Call Doctor if any unusual problems occur such as pain, excessive Bleeding, unrelieved Nausea/vomiting, Fever &/or chills  Follow-up appointment: Scheduled for next week.   Post Anesthesia Home Care Instructions  Activity: Get plenty of rest for the remainder of the day. A responsible individual must stay with you for 24 hours following the procedure.  For the next 24 hours, DO NOT: -Drive a car -Operate machinery -Drink alcoholic beverages -Take any medication unless instructed by your physician -Make any legal decisions or sign important papers.  Meals: Start with liquid foods such as gelatin or soup. Progress to regular foods as tolerated. Avoid greasy, spicy, heavy foods. If nausea and/or vomiting occur, drink only clear liquids until the nausea and/or vomiting subsides. Call your physician if vomiting continues.  Special Instructions/Symptoms: Your throat may feel dry or sore from the anesthesia or the breathing tube placed in your throat during surgery. If this causes discomfort, gargle with warm salt water. The discomfort should disappear within 24 hours.  If you had a scopolamine patch placed behind your ear for the management of post-  operative nausea and/or vomiting:  1. The medication in the patch is effective for 72 hours, after which it should be removed.  Wrap patch in a tissue and discard in the trash. Wash hands thoroughly with soap and water. 2. You may remove the patch earlier than 72 hours if you experience unpleasant side effects which may include dry mouth, dizziness or visual disturbances. 3. Avoid touching the patch. Wash your hands with soap and water after contact with the patch.       

## 2020-05-05 NOTE — Brief Op Note (Signed)
05/05/2020  9:42 AM  PATIENT:  Jessica Rhodes  29 y.o. female  PRE-OPERATIVE DIAGNOSIS:  macromastia  POST-OPERATIVE DIAGNOSIS:  macromastia  PROCEDURE:  Procedure(s): MAMMARY REDUCTION  (BREAST) (Bilateral)  SURGEON:  Surgeon(s) and Role:    * Emonni Depasquale, Wendy Poet, MD - Primary  PHYSICIAN ASSISTANT: Materials engineer, PA  ASSISTANTS: none   ANESTHESIA:   general  EBL:  50   BLOOD ADMINISTERED:none  DRAINS: none   LOCAL MEDICATIONS USED:  MARCAINE     SPECIMEN:  Source of Specimen:  r and l breast tissue  DISPOSITION OF SPECIMEN:  PATHOLOGY  COUNTS:  YES  TOURNIQUET:  * No tourniquets in log *  DICTATION: .Dragon Dictation  PLAN OF CARE: Discharge to home after PACU  PATIENT DISPOSITION:  PACU - hemodynamically stable.   Delay start of Pharmacological VTE agent (>24hrs) due to surgical blood loss or risk of bleeding: not applicable

## 2020-05-05 NOTE — Anesthesia Procedure Notes (Signed)
Procedure Name: Intubation Date/Time: 05/05/2020 7:44 AM Performed by: Signe Colt, CRNA Pre-anesthesia Checklist: Patient identified, Emergency Drugs available, Suction available and Patient being monitored Patient Re-evaluated:Patient Re-evaluated prior to induction Oxygen Delivery Method: Circle system utilized Preoxygenation: Pre-oxygenation with 100% oxygen Induction Type: IV induction Ventilation: Mask ventilation without difficulty Laryngoscope Size: Mac and 3 Grade View: Grade I Tube type: Oral Tube size: 7.0 mm Number of attempts: 1 Airway Equipment and Method: Stylet and Oral airway Placement Confirmation: ETT inserted through vocal cords under direct vision,  positive ETCO2 and breath sounds checked- equal and bilateral Secured at: 21 cm Tube secured with: Tape Dental Injury: Teeth and Oropharynx as per pre-operative assessment

## 2020-05-06 ENCOUNTER — Encounter (HOSPITAL_BASED_OUTPATIENT_CLINIC_OR_DEPARTMENT_OTHER): Payer: Self-pay | Admitting: Plastic Surgery

## 2020-05-07 LAB — SURGICAL PATHOLOGY

## 2020-05-12 ENCOUNTER — Ambulatory Visit (INDEPENDENT_AMBULATORY_CARE_PROVIDER_SITE_OTHER): Payer: PRIVATE HEALTH INSURANCE | Admitting: Plastic Surgery

## 2020-05-12 ENCOUNTER — Other Ambulatory Visit: Payer: Self-pay

## 2020-05-12 VITALS — BP 116/81 | HR 79 | Temp 98.7°F

## 2020-05-12 DIAGNOSIS — N62 Hypertrophy of breast: Secondary | ICD-10-CM

## 2020-05-12 NOTE — Progress Notes (Signed)
Patient presents 1 week postop from bilateral breast reduction.  She is overall very happy.  On exam everything looks good with healthy skin, viable and the periareolar complexes, and intact incisions.  There is no detectable subcutaneous swelling.  There is diffuse swelling throughout which is typical at this point.  I advised continuing compression and avoiding strenuous activity and will see her again next week.

## 2020-05-19 ENCOUNTER — Encounter: Payer: Self-pay | Admitting: Surgical

## 2020-05-19 ENCOUNTER — Ambulatory Visit (INDEPENDENT_AMBULATORY_CARE_PROVIDER_SITE_OTHER): Payer: PRIVATE HEALTH INSURANCE | Admitting: Surgical

## 2020-05-19 ENCOUNTER — Other Ambulatory Visit: Payer: Self-pay

## 2020-05-19 VITALS — BP 128/87 | HR 81 | Temp 99.0°F

## 2020-05-19 DIAGNOSIS — M4004 Postural kyphosis, thoracic region: Secondary | ICD-10-CM

## 2020-05-19 DIAGNOSIS — M546 Pain in thoracic spine: Secondary | ICD-10-CM

## 2020-05-19 DIAGNOSIS — M545 Low back pain, unspecified: Secondary | ICD-10-CM

## 2020-05-19 DIAGNOSIS — N62 Hypertrophy of breast: Secondary | ICD-10-CM

## 2020-05-19 NOTE — Progress Notes (Signed)
Patient is a 29 year old female here for follow-up after bilateral breast reduction on 05/05/2020 with Dr. Arita Miss.  She is here for 2-week follow-up.  She is doing really well, reports pain is easily controlled and very mild.  She has been taking Tylenol for this.  On exam bilateral breast incisions are intact, Steri-Strips in place, NAC are viable with good color.  Bilateral breasts are symmetric, soft, no fluid wave noted on exam, nontender to palpation.  No erythema noted.  Recommend following up in 2 weeks for reevaluation, call with any questions or concerns prior to next follow-up. Continue to avoid strenuous activity Recommend continuing to wear compressive garment 24/7

## 2020-05-25 ENCOUNTER — Telehealth: Payer: Self-pay

## 2020-05-25 NOTE — Telephone Encounter (Signed)
Patient called with concerns regarding her incision site. She states the area feels "hard" and would like a call back to discuss.

## 2020-05-25 NOTE — Telephone Encounter (Signed)
Call to pt re: her concern with " tight/lump" area on bilateral breast incisions/lateral aspect.   right side > left.  This is a new finding that began yesterday 05/24/20. The incisions are intact. She states the swelling overall has decreased on both sides- and pain has improved she reports only minimal pain which she takes OTC Tylenol or Ibuprofen once daily if needed. Denies any drainage,chills/fever or redness. She continues to wear sports bra 24/7. Eating/drinking well- with normal bowel/bladder function. We discussed the area of concern and she is just wanting to make sure that this is not something to be concerned with- I reviewed how the incisions can be raised and more pronounced at this time -but will continue to flatten & become less reactive.   When given the option of coming in prior to her next f/u on 06/03/20- she will continue to monitor for any signs/symptoms of concern- like increased redness/swelling or chills/fever & she will call for any change.

## 2020-06-02 NOTE — Progress Notes (Deleted)
Patient is a 29 year old female here for follow-up after bilateral breast reduction on 05/05/2020 with Dr. Arita Miss.

## 2020-06-03 ENCOUNTER — Ambulatory Visit: Payer: PRIVATE HEALTH INSURANCE | Admitting: Surgical

## 2020-06-04 DIAGNOSIS — Z9889 Other specified postprocedural states: Secondary | ICD-10-CM | POA: Insufficient documentation

## 2020-06-04 NOTE — Progress Notes (Deleted)
Patient is a 29 year old female here for follow-up after undergoing bilateral breast reduction on 05/05/2020 with Dr. Arita Miss.  ~ 5 weeks PO

## 2020-06-09 ENCOUNTER — Ambulatory Visit: Payer: PRIVATE HEALTH INSURANCE | Admitting: Plastic Surgery

## 2020-07-01 ENCOUNTER — Encounter: Payer: Self-pay | Admitting: Plastic Surgery

## 2020-07-01 ENCOUNTER — Ambulatory Visit (INDEPENDENT_AMBULATORY_CARE_PROVIDER_SITE_OTHER): Payer: PRIVATE HEALTH INSURANCE | Admitting: Plastic Surgery

## 2020-07-01 ENCOUNTER — Other Ambulatory Visit: Payer: Self-pay

## 2020-07-01 VITALS — BP 173/80 | HR 74 | Temp 98.7°F

## 2020-07-01 DIAGNOSIS — Z9889 Other specified postprocedural states: Secondary | ICD-10-CM

## 2020-07-01 NOTE — Progress Notes (Signed)
Patient is a 29 year old female here for follow-up after bilateral breast reduction on 05/05/2020 with Dr. Arita Miss.  ~ 8 weeks PO Patient reports she is doing very well.  Denies fever/chills, nausea/vomiting, pain.  Bilateral incisions are healing very nicely, C/D/I.  No signs of infection, redness, drainage, seroma/hematoma.  She can continue to wear sports bra during the day for 1 more month but no longer needs to wear it at night.  She can resume normal activities as tolerated.  She can shower normally.  Follow-up as needed.  Call office with any questions/concerns.  Pictures were obtained of the patient and placed in the chart with the patient's or guardian's permission.

## 2021-01-31 DIAGNOSIS — Z20822 Contact with and (suspected) exposure to covid-19: Secondary | ICD-10-CM | POA: Diagnosis not present

## 2021-04-29 DIAGNOSIS — R41840 Attention and concentration deficit: Secondary | ICD-10-CM | POA: Diagnosis not present

## 2021-04-29 DIAGNOSIS — F411 Generalized anxiety disorder: Secondary | ICD-10-CM | POA: Diagnosis not present

## 2021-05-30 DIAGNOSIS — L2089 Other atopic dermatitis: Secondary | ICD-10-CM | POA: Diagnosis not present

## 2021-07-25 DIAGNOSIS — L2089 Other atopic dermatitis: Secondary | ICD-10-CM | POA: Diagnosis not present

## 2021-08-08 DIAGNOSIS — F411 Generalized anxiety disorder: Secondary | ICD-10-CM | POA: Diagnosis not present

## 2021-08-08 DIAGNOSIS — F909 Attention-deficit hyperactivity disorder, unspecified type: Secondary | ICD-10-CM | POA: Diagnosis not present

## 2021-08-17 DIAGNOSIS — F411 Generalized anxiety disorder: Secondary | ICD-10-CM | POA: Diagnosis not present

## 2021-08-17 DIAGNOSIS — F909 Attention-deficit hyperactivity disorder, unspecified type: Secondary | ICD-10-CM | POA: Diagnosis not present

## 2021-08-18 DIAGNOSIS — F411 Generalized anxiety disorder: Secondary | ICD-10-CM | POA: Diagnosis not present

## 2021-08-18 DIAGNOSIS — F909 Attention-deficit hyperactivity disorder, unspecified type: Secondary | ICD-10-CM | POA: Diagnosis not present

## 2021-08-30 DIAGNOSIS — A499 Bacterial infection, unspecified: Secondary | ICD-10-CM | POA: Diagnosis not present

## 2021-10-22 ENCOUNTER — Other Ambulatory Visit: Payer: Self-pay

## 2021-10-22 ENCOUNTER — Emergency Department: Payer: BC Managed Care – PPO

## 2021-10-22 ENCOUNTER — Emergency Department
Admission: EM | Admit: 2021-10-22 | Discharge: 2021-10-22 | Disposition: A | Discharge status: Home / Self Care | Payer: BC Managed Care – PPO | Attending: Emergency Medicine | Admitting: Emergency Medicine

## 2021-10-22 ENCOUNTER — Encounter: Payer: Self-pay | Admitting: Emergency Medicine

## 2021-10-22 DIAGNOSIS — R55 Syncope and collapse: Secondary | ICD-10-CM | POA: Diagnosis not present

## 2021-10-22 DIAGNOSIS — R944 Abnormal results of kidney function studies: Secondary | ICD-10-CM | POA: Insufficient documentation

## 2021-10-22 DIAGNOSIS — R42 Dizziness and giddiness: Secondary | ICD-10-CM | POA: Insufficient documentation

## 2021-10-22 DIAGNOSIS — R1084 Generalized abdominal pain: Secondary | ICD-10-CM | POA: Diagnosis not present

## 2021-10-22 DIAGNOSIS — D72829 Elevated white blood cell count, unspecified: Secondary | ICD-10-CM | POA: Diagnosis not present

## 2021-10-22 DIAGNOSIS — R112 Nausea with vomiting, unspecified: Secondary | ICD-10-CM

## 2021-10-22 DIAGNOSIS — R197 Diarrhea, unspecified: Secondary | ICD-10-CM | POA: Insufficient documentation

## 2021-10-22 DIAGNOSIS — R109 Unspecified abdominal pain: Secondary | ICD-10-CM | POA: Diagnosis not present

## 2021-10-22 DIAGNOSIS — R110 Nausea: Secondary | ICD-10-CM | POA: Diagnosis not present

## 2021-10-22 LAB — COMPREHENSIVE METABOLIC PANEL
ALT: 16 U/L (ref 0–44)
AST: 18 U/L (ref 15–41)
Albumin: 4.6 g/dL (ref 3.5–5.0)
Alkaline Phosphatase: 66 U/L (ref 38–126)
Anion gap: 11 (ref 5–15)
BUN: 20 mg/dL (ref 6–20)
CO2: 22 mmol/L (ref 22–32)
Calcium: 9.5 mg/dL (ref 8.9–10.3)
Chloride: 104 mmol/L (ref 98–111)
Creatinine, Ser: 1.05 mg/dL — ABNORMAL HIGH (ref 0.44–1.00)
GFR, Estimated: >60 mL/min (ref >60)
Glucose, Bld: 153 mg/dL — ABNORMAL HIGH (ref 70–99)
Potassium: 3.8 mmol/L (ref 3.5–5.1)
Sodium: 137 mmol/L (ref 135–145)
Total Bilirubin: 0.7 mg/dL (ref 0.3–1.2)
Total Protein: 7.9 g/dL (ref 6.5–8.1)

## 2021-10-22 LAB — CBC
HCT: 45.1 % (ref 36.0–46.0)
Hemoglobin: 14.9 g/dL (ref 12.0–15.0)
MCH: 28.7 pg (ref 26.0–34.0)
MCHC: 33 g/dL (ref 30.0–36.0)
MCV: 86.7 fL (ref 80.0–100.0)
Platelets: 357 10*3/uL (ref 150–400)
RBC: 5.2 MIL/uL — ABNORMAL HIGH (ref 3.87–5.11)
RDW: 12.7 % (ref 11.5–15.5)
WBC: 17.1 10*3/uL — ABNORMAL HIGH (ref 4.0–10.5)
nRBC: 0 % (ref 0.0–0.2)

## 2021-10-22 LAB — URINALYSIS, ROUTINE W REFLEX MICROSCOPIC
Bilirubin Urine: NEGATIVE
Glucose, UA: NEGATIVE mg/dL
Hgb urine dipstick: NEGATIVE
Ketones, ur: NEGATIVE mg/dL
Leukocytes,Ua: NEGATIVE
Nitrite: NEGATIVE
Protein, ur: 30 mg/dL — AB
Specific Gravity, Urine: 1.03 (ref 1.005–1.030)
pH: 5 (ref 5.0–8.0)

## 2021-10-22 LAB — POC URINE PREG, ED: Preg Test, Ur: NEGATIVE

## 2021-10-22 LAB — LIPASE, BLOOD: Lipase: 34 U/L (ref 11–51)

## 2021-10-22 IMAGING — CT CT ABD-PELV W/ CM
2 of 4 series · 16 of 46 positions shown, 18 images · IV contrast (APPLIED)
Comparison: None.

CLINICAL DATA: Acute, nonlocalized abdominal pain

EXAM:
CT ABDOMEN AND PELVIS WITH CONTRAST
TECHNIQUE: Multidetector CT imaging of the abdomen and pelvis was performed
using the standard protocol following bolus administration of
intravenous contrast.

[Series 2: routine abd/pel with · axial · 0.83mm/px · z∈[-1070,-570]mm · 13 of 110 slices shown, 15 images]
[im 5/110  soft-tissue]
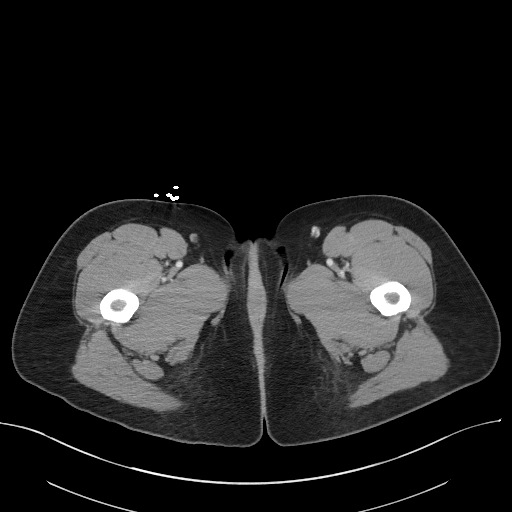
[im 5/110  bone]
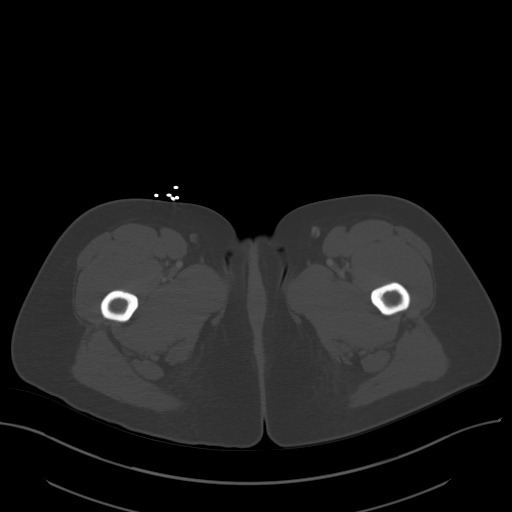
[im 15/110  soft-tissue]
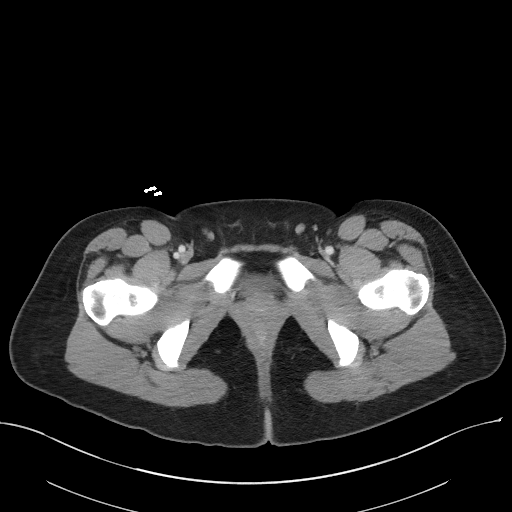
[im 25/110  soft-tissue]
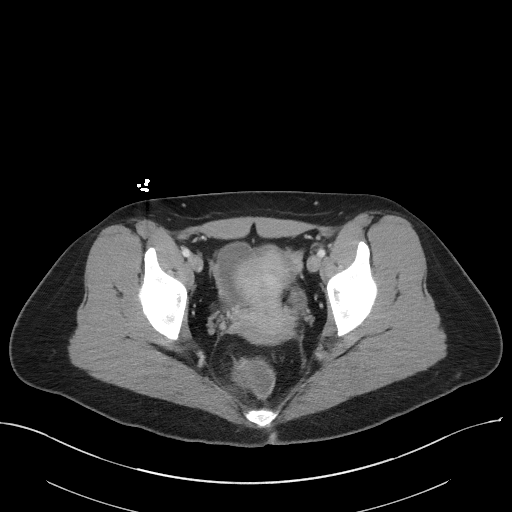
[im 30/110  soft-tissue]
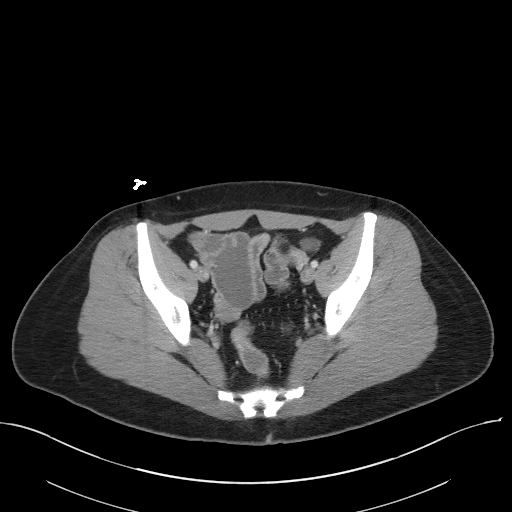
[im 40/110  soft-tissue]
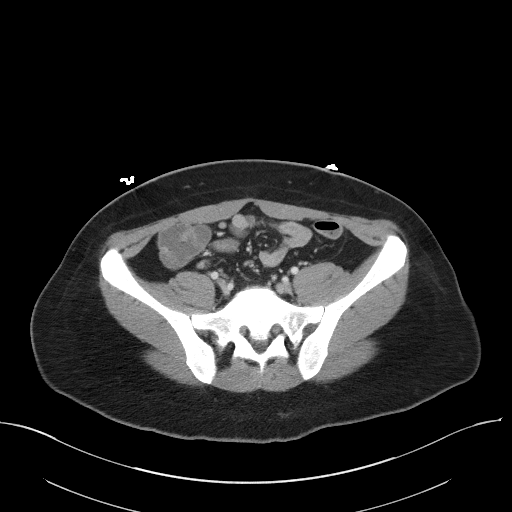
[im 45/110  soft-tissue]
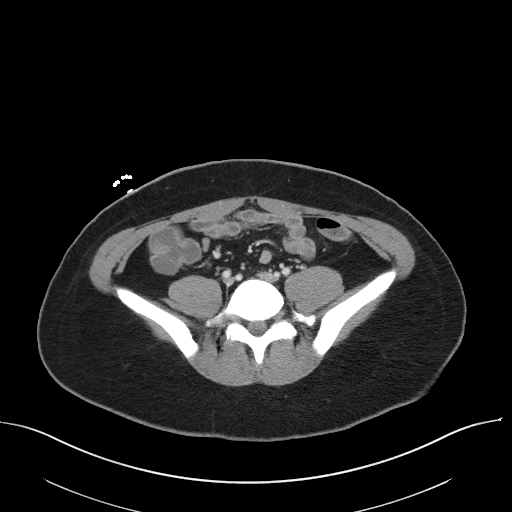
[im 55/110  soft-tissue]
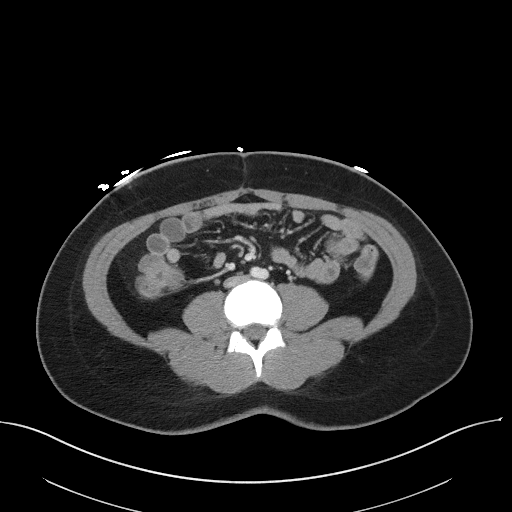
[im 65/110  soft-tissue]
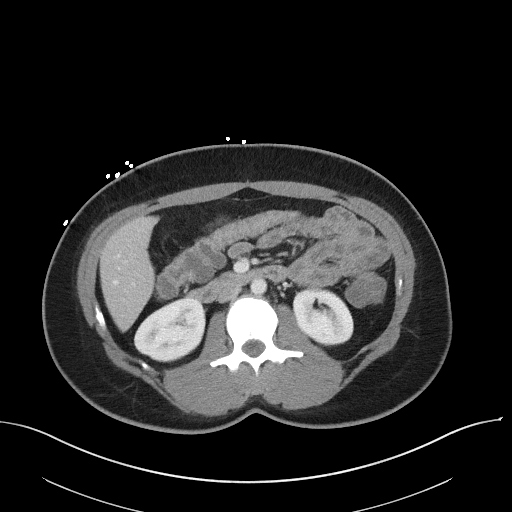
[im 70/110  soft-tissue]
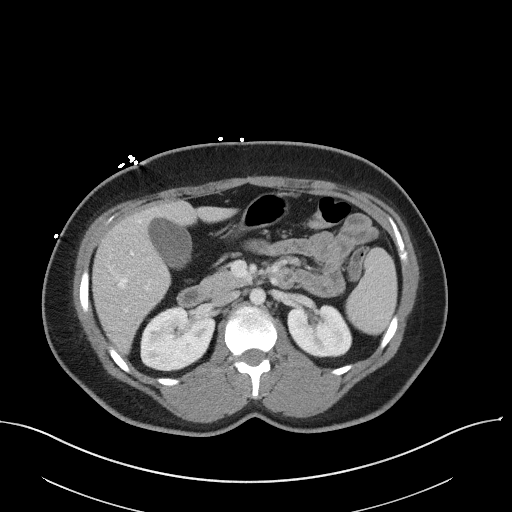
[im 70/110  bone]
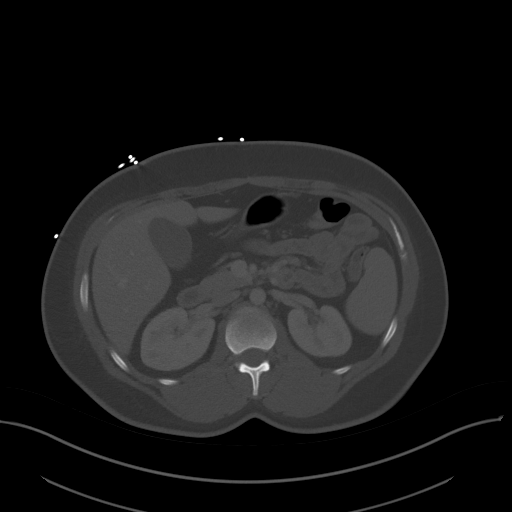
[im 80/110  soft-tissue]
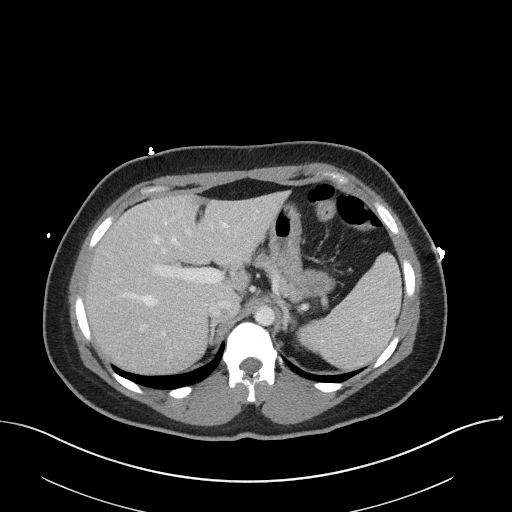
[im 85/110  soft-tissue]
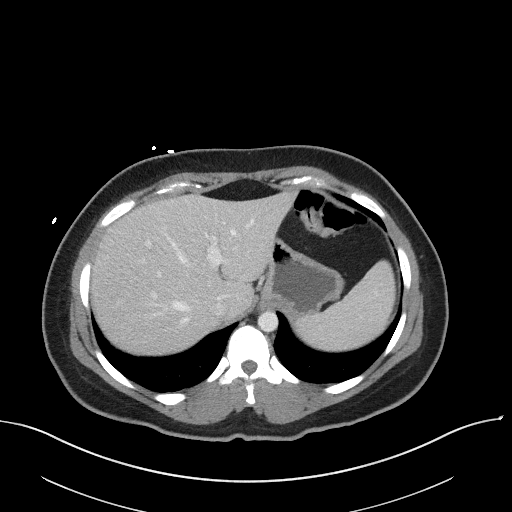
[im 95/110  soft-tissue]
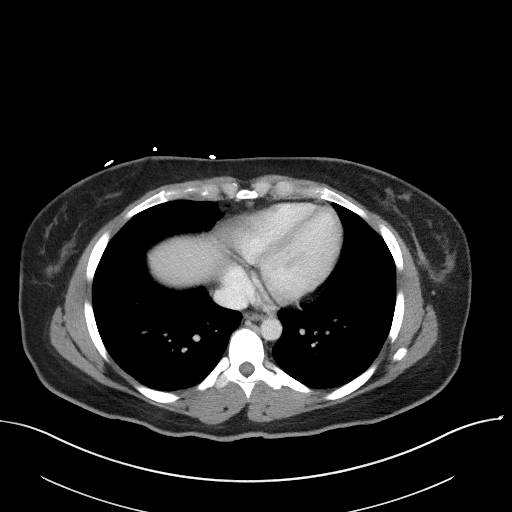
[im 105/110  soft-tissue]
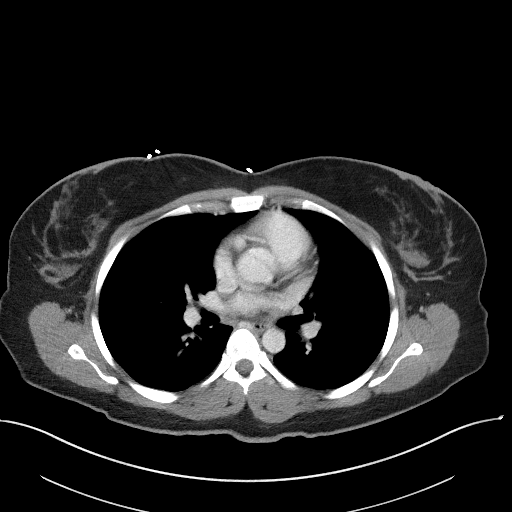

[Series 5: coronal st · coronal · 0.81mm/px · 3 of 86 slices shown]
[im 29/86  soft-tissue]
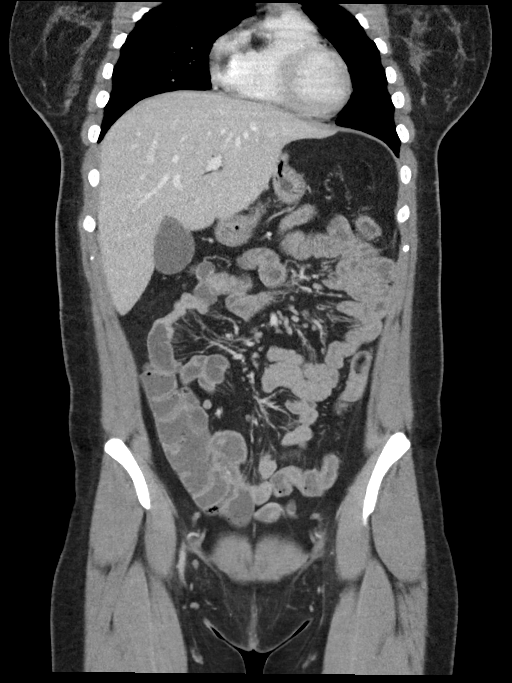
[im 38/86  soft-tissue]
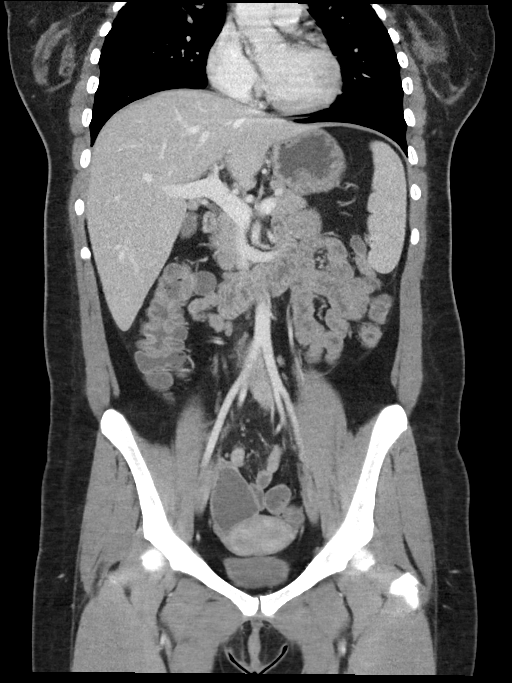
[im 48/86  soft-tissue]
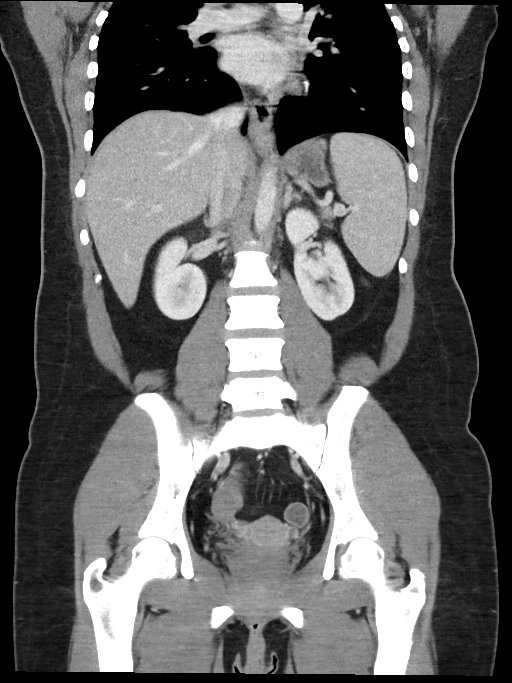

[16 of 46 positions shown; findings below may reference images not displayed]

RADIATION DOSE REDUCTION: This exam was performed according to the
departmental dose-optimization program which includes automated
exposure control, adjustment of the mA and/or kV according to
patient size and/or use of iterative reconstruction technique.

CONTRAST:  80mL OMNIPAQUE IOHEXOL 300 MG/ML  SOLN
FINDINGS: Lower chest:  No contributory findings.

Hepatobiliary: No focal liver abnormality.No evidence of biliary
obstruction or stone.

Pancreas: Unremarkable.

Spleen: Unremarkable.

Adrenals/Urinary Tract: Negative adrenals. No hydronephrosis or
stone. Tiny cystic density at the upper pole left kidney
unremarkable bladder.

Stomach/Bowel:  No obstruction. No appendicitis.

Vascular/Lymphatic: No acute vascular abnormality. No mass or
adenopathy.

Reproductive:Corpus luteum on the left. Suspect subseptate uterus
shape.

Other: No ascites or pneumoperitoneum.

Musculoskeletal: No acute abnormalities.
IMPRESSION: No acute finding.

## 2021-10-22 MED ORDER — ONDANSETRON 4 MG PO TBDP: 4.0000 mg | ORAL_TABLET | Freq: Three times a day (TID) | ORAL | 0 refills | Status: DC | PRN

## 2021-10-22 MED ORDER — IOHEXOL 300 MG/ML  SOLN
80.0000 mL | Freq: Once | INTRAMUSCULAR | Status: AC | PRN
  Administered 2021-10-22: 80 mL via INTRAVENOUS

## 2021-10-22 MED ORDER — SODIUM CHLORIDE 0.9 % IV BOLUS (SEPSIS)
1000.0000 mL | Freq: Once | INTRAVENOUS | Status: AC
  Administered 2021-10-22: 1000 mL via INTRAVENOUS

## 2021-10-22 MED ORDER — FENTANYL CITRATE PF 50 MCG/ML IJ SOSY
50.0000 ug | PREFILLED_SYRINGE | Freq: Once | INTRAMUSCULAR | Status: AC
  Administered 2021-10-22: 50 ug via INTRAVENOUS
  Filled 2021-10-22: qty 1

## 2021-10-22 MED ORDER — ONDANSETRON HCL 4 MG/2ML IJ SOLN
4.0000 mg | Freq: Once | INTRAMUSCULAR | Status: AC
  Administered 2021-10-22: 4 mg via INTRAVENOUS
  Filled 2021-10-22: qty 2

## 2021-10-22 MED ORDER — DICYCLOMINE HCL 20 MG PO TABS: 20.0000 mg | ORAL_TABLET | Freq: Four times a day (QID) | ORAL | 0 refills | Status: DC | PRN

## 2021-10-22 MED ORDER — FAMOTIDINE 20 MG PO TABS: 20.0000 mg | ORAL_TABLET | Freq: Two times a day (BID) | ORAL | 0 refills | Status: DC

## 2021-10-22 NOTE — ED Provider Notes (Signed)
Procedures     ----------------------------------------- 9:07 AM on 10/22/2021 ----------------------------------------- CT abdomen pelvis viewed and interpreted by me, no acute findings.  Radiology report reviewed.  Patient is tolerating oral intake, vitals are stable, pain is controlled, not requiring admission due to improvement and ability to tolerate oral intake with reassuring work-up.     Sharman Cheek, MD 10/22/21 [phone]

## 2021-10-22 NOTE — ED Notes (Signed)
Pt denies N/V after PO challenge.

## 2021-10-22 NOTE — Discharge Instructions (Addendum)
Your CT scan did not show any acute findings.  This appears to be a viral illness, and you should focus on drinking fluids and staying hydrated while it runs its course.

## 2021-10-22 NOTE — ED Notes (Signed)
Pt given water for PO challenge 

## 2021-10-22 NOTE — ED Triage Notes (Signed)
Pt to ED via [facility] from home with c/o N/V/D and syncope. Pt reports symptoms started approx 2 hrs ago, reports has had syncopal episode x 3. Pt denies any pain at this time, pt states symptoms awoke her from sleep.    EMS VS 80HR CBG 125 20G L AC  4mg  Zofran given en route   Pt denies head injury, states +LOC for several seconds each time.

## 2021-10-22 NOTE — ED Notes (Signed)
Pt resting at this time. Pt requests to use restroom, pt ambulatory to bedside toilet with steady gait. NAD noted. IVF at this time. Call bell in reach.

## 2021-10-22 NOTE — ED Notes (Signed)
D/C, RX, and reasons to return to ED discussed with pt and reasons to return to ED, pt and husband verbalized understanding. Pt ambulatory with steady gait on D/C. VSS. NAD.

## 2021-10-22 NOTE — ED Notes (Signed)
Pt up for D/C, MD wants pt to finish IVF before D/C. Will D/C when IVF are finished, pt updated on plan of care.

## 2021-10-22 NOTE — ED Provider Notes (Signed)
[facility] Provider Note    Event Date/Time   First MD Initiated Contact with Patient 10/22/21 0559     (approximate)   History   Emesis, Diarrhea, and Loss of Consciousness   HPI  Jessica Rhodes is a 31 y.o. female with anxiety who presents to the emergency department with her husband for concerns for nausea, vomiting and diarrhea that started at midnight.  Is having diffuse abdominal soreness from multiple episodes of vomiting.  States she is now seeing bright red blood in her stool.  No recent travel, sick contact, hospitalization, antibiotic use.  No previous abdominal surgery.  Denies dysuria, hematuria, vaginal bleeding or discharge.  She states that she has felt very lightheaded and had 3 syncopal events at home.  No preceding chest pain or shortness of breath.  No lower extremity swelling or pain.   History provided by patient and husband.    Past Medical History:  Diagnosis Date   Allergy    Anxiety     Past Surgical History:  Procedure Laterality Date   BREAST REDUCTION SURGERY Bilateral 05/05/2020   Procedure: MAMMARY REDUCTION  (BREAST);  Surgeon: Cindra Presume, MD;  Location: [facility];  Service: Plastics;  Laterality: Bilateral;   TONSILECTOMY/ADENOIDECTOMY WITH MYRINGOTOMY      MEDICATIONS:  Prior to Admission medications   Medication Sig Start Date End Date Taking? Authorizing Provider  escitalopram (LEXAPRO) 10 MG tablet Take 20 mg by mouth daily.     [provider]    Physical Exam   Triage Vital Signs: ED Triage Vitals  Enc Vitals Group     BP 10/22/21 0342 98/65     Pulse Rate 10/22/21 0342 84     Resp 10/22/21 0342 20     Temp 10/22/21 0342 (!) 97.4 F (36.3 C)     Temp Source 10/22/21 0342 Oral     SpO2 10/22/21 0342 97 %     Weight 10/22/21 0342 165 lb (74.8 kg)     Height 10/22/21 0342 5\' 3"  (1.6 m)     Head Circumference --      Peak Flow --      Pain Score 10/22/21 0358 0      Pain Loc --      Pain Edu? --      Excl. in [place]? --     Most recent vital signs: Vitals:   10/22/21 0630 10/22/21 0700  BP: 114/75 114/69  Pulse: 78 86  Resp: 19 18  Temp:    SpO2: 95% 97%    CONSTITUTIONAL: Alert and oriented and responds appropriately to questions.  Appears uncomfortable, pale HEAD: Normocephalic, atraumatic EYES: Conjunctivae clear, pupils appear equal, sclera nonicteric, no conjunctival pallor ENT: normal nose; dry mucous membranes NECK: Supple, normal ROM CARD: RRR; S1 and S2 appreciated; no murmurs, no clicks, no rubs, no gallops RESP: Normal chest excursion without splinting or tachypnea; breath sounds clear and equal bilaterally; no wheezes, no rhonchi, no rales, no hypoxia or respiratory distress, speaking full sentences ABD/GI: Normal bowel sounds; non-distended; soft, diffusely tender to palpation without guarding or rebound BACK: The back appears normal EXT: Normal ROM in all joints; no deformity noted, no edema; no cyanosis SKIN: Normal color for age and race; warm; no rash on exposed skin NEURO: Moves all extremities equally, normal speech PSYCH: The patient's mood and manner are appropriate.   ED Results / Procedures / Treatments   LABS: (all labs ordered are listed, but  only abnormal results are displayed) Labs Reviewed  COMPREHENSIVE METABOLIC PANEL - Abnormal; Notable for the following components:      Result Value   Glucose, Bld 153 (*)    Creatinine, Ser 1.05 (*)    All other components within normal limits  CBC - Abnormal; Notable for the following components:   WBC 17.1 (*)    RBC 5.20 (*)    All other components within normal limits  URINALYSIS, ROUTINE W REFLEX MICROSCOPIC - Abnormal; Notable for the following components:   Color, Urine AMBER (*)    APPearance HAZY (*)    Protein, ur 30 (*)    Bacteria, UA RARE (*)    All other components within normal limits  LIPASE, BLOOD  POC URINE PREG, ED     EKG:  EKG  Interpretation  Date/Time:  Saturday October 22 2021 04:03:36 EST Ventricular Rate:  77 PR Interval:  134 QRS Duration: 84 QT Interval:  406 QTC Calculation: 459 R Axis:   48 Text Interpretation: Normal sinus rhythm Cannot rule out Anterior infarct , age undetermined Abnormal ECG No previous ECGs available Confirmed by Pryor Curia [phone]) on 10/22/2021 6:13:20 AM         RADIOLOGY: My personal review and interpretation of imaging:    I have personally reviewed all radiology reports.   No results found.   PROCEDURES:  Critical Care performed: No   CRITICAL CARE Performed by: Pryor Curia   Total critical care time: 0 minutes  Critical care time was exclusive of separately billable procedures and treating other patients.  Critical care was necessary to treat or prevent imminent or life-threatening deterioration.  Critical care was time spent personally by me on the following activities: development of treatment plan with patient and/or surrogate as well as nursing, discussions with consultants, evaluation of patient's response to treatment, examination of patient, obtaining history from patient or surrogate, ordering and performing treatments and interventions, ordering and review of laboratory studies, ordering and review of radiographic studies, pulse oximetry and re-evaluation of patient's condition.   Marland Kitchen1-3 Lead EKG Interpretation Performed by: Casin Federici, Delice Bison, DO Authorized by: Mohamud Mrozek, Delice Bison, DO     Interpretation: normal     ECG rate:  80   ECG rate assessment: normal     Rhythm: sinus rhythm     Ectopy: none     Conduction: normal      IMPRESSION / MDM / ASSESSMENT AND PLAN / ED COURSE  I reviewed the triage vital signs and the nursing notes.    Patient here with nausea, vomiting and diarrhea and 3 syncopal events.  The patient is on the cardiac monitor to evaluate for evidence of arrhythmia and/or significant heart rate changes.   DIFFERENTIAL  DIAGNOSIS (includes but not limited to):   Viral gastroenteritis, dehydration, colitis, diverticulitis, appendicitis.  Doubt ACS, PE, dissection.  No history of IBD.   PLAN: We will obtain CBC, CMP, lipase, urine, CT of the abdomen pelvis.  Will give IV fluids, pain and nausea medicine.   MEDICATIONS GIVEN IN ED: Medications  sodium chloride 0.9 % bolus 1,000 mL (1,000 mLs Intravenous New Bag/Given 10/22/21 0636)  sodium chloride 0.9 % bolus 1,000 mL (1,000 mLs Intravenous New Bag/Given 10/22/21 [id number])  fentaNYL (SUBLIMAZE) injection 50 mcg (50 mcg Intravenous Given 10/22/21 0631)  ondansetron (ZOFRAN) injection 4 mg (4 mg Intravenous Given 10/22/21 0629)  iohexol (OMNIPAQUE) 300 MG/ML solution 80 mL (80 mLs Intravenous Contrast Given 10/22/21 0722)     ED  COURSE: Labs show a leukocytosis of 17,000.  Normal hemoglobin.  Creatinine minimally elevated at 1.05 but normal electrolytes.  Urine shows no signs of infection.  Pregnancy test negative.  CT of the abdomen pelvis pending for further evaluation.  EKG nonischemic without arrhythmia.  Signed out to oncoming ED physician.   CONSULTS: Dispo pending per CT results.   OUTSIDE RECORDS REVIEWED: Reviewed patient's previous plastic surgery operative note on 05/05/2020.         FINAL CLINICAL IMPRESSION(S) / ED DIAGNOSES   Final diagnoses:  Nausea vomiting and diarrhea  Syncope, unspecified syncope type     Rx / DC Orders   ED Discharge Orders     None        Note:  This document was prepared using Dragon voice recognition software and may include unintentional dictation errors.   Neomia Herbel, Delice Bison, DO 10/22/21 [phone]

## 2021-12-01 DIAGNOSIS — F909 Attention-deficit hyperactivity disorder, unspecified type: Secondary | ICD-10-CM | POA: Diagnosis not present

## 2021-12-01 DIAGNOSIS — F411 Generalized anxiety disorder: Secondary | ICD-10-CM | POA: Diagnosis not present

## 2022-02-03 DIAGNOSIS — F411 Generalized anxiety disorder: Secondary | ICD-10-CM | POA: Diagnosis not present

## 2022-02-03 DIAGNOSIS — F909 Attention-deficit hyperactivity disorder, unspecified type: Secondary | ICD-10-CM | POA: Diagnosis not present

## 2022-04-06 ENCOUNTER — Ambulatory Visit: Payer: Self-pay | Admitting: Physician Assistant

## 2022-05-08 DIAGNOSIS — F909 Attention-deficit hyperactivity disorder, unspecified type: Secondary | ICD-10-CM | POA: Diagnosis not present

## 2022-05-08 DIAGNOSIS — Z23 Encounter for immunization: Secondary | ICD-10-CM | POA: Diagnosis not present

## 2022-05-08 DIAGNOSIS — F411 Generalized anxiety disorder: Secondary | ICD-10-CM | POA: Diagnosis not present

## 2022-05-30 DIAGNOSIS — Z01419 Encounter for gynecological examination (general) (routine) without abnormal findings: Secondary | ICD-10-CM | POA: Diagnosis not present

## 2022-05-30 DIAGNOSIS — Z6833 Body mass index (BMI) 33.0-33.9, adult: Secondary | ICD-10-CM | POA: Diagnosis not present

## 2022-05-30 DIAGNOSIS — Z8742 Personal history of other diseases of the female genital tract: Secondary | ICD-10-CM | POA: Diagnosis not present

## 2022-05-30 DIAGNOSIS — Z3401 Encounter for supervision of normal first pregnancy, first trimester: Secondary | ICD-10-CM | POA: Diagnosis not present

## 2022-05-30 DIAGNOSIS — Z3A01 Less than 8 weeks gestation of pregnancy: Secondary | ICD-10-CM | POA: Diagnosis not present

## 2022-05-30 DIAGNOSIS — Z3201 Encounter for pregnancy test, result positive: Secondary | ICD-10-CM | POA: Diagnosis not present

## 2022-05-30 LAB — OB RESULTS CONSOLE GC/CHLAMYDIA
Chlamydia: NEGATIVE
Neisseria Gonorrhea: NEGATIVE

## 2022-05-31 DIAGNOSIS — Z3A01 Less than 8 weeks gestation of pregnancy: Secondary | ICD-10-CM | POA: Diagnosis not present

## 2022-05-31 DIAGNOSIS — O3680X0 Pregnancy with inconclusive fetal viability, not applicable or unspecified: Secondary | ICD-10-CM | POA: Diagnosis not present

## 2022-06-21 ENCOUNTER — Telehealth (INDEPENDENT_AMBULATORY_CARE_PROVIDER_SITE_OTHER): Payer: PRIVATE HEALTH INSURANCE

## 2022-06-21 DIAGNOSIS — F909 Attention-deficit hyperactivity disorder, unspecified type: Secondary | ICD-10-CM | POA: Insufficient documentation

## 2022-06-21 DIAGNOSIS — Z8742 Personal history of other diseases of the female genital tract: Secondary | ICD-10-CM | POA: Insufficient documentation

## 2022-06-21 DIAGNOSIS — O0992 Supervision of high risk pregnancy, unspecified, second trimester: Secondary | ICD-10-CM | POA: Insufficient documentation

## 2022-06-21 DIAGNOSIS — Z3400 Encounter for supervision of normal first pregnancy, unspecified trimester: Secondary | ICD-10-CM

## 2022-06-21 DIAGNOSIS — Z3480 Encounter for supervision of other normal pregnancy, unspecified trimester: Secondary | ICD-10-CM

## 2022-06-21 NOTE — Progress Notes (Addendum)
New OB Intake  I connected with  Dierdra B Magro on 06/21/22 at  2:15 PM EDT by MyChart Video Visit and verified that I am speaking with the correct person using two identifiers. Nurse is located at [facility] and pt is located at home.  I discussed the limitations, risks, security and privacy concerns of performing an evaluation and management service by telephone and the availability of in person appointments. I also discussed with the patient that there may be a patient responsible charge related to this service. The patient expressed understanding and agreed to proceed.  I explained I am completing New OB Intake today. We discussed her EDD of 01/21/2023 that is based on LMP of 04/16/2022. Pt is G1/P0. I reviewed her allergies, medications, Medical/Surgical/OB history, and appropriate screenings. I informed her of [facility] services. [facility] information placed in AVS. Based on history, this is a/an  pregnancy uncomplicated .  Patient Active Problem List   Diagnosis Date Noted   Supervision of other normal pregnancy, antepartum 06/21/2022   ADHD 06/21/2022   History of abnormal cervical Pap smear 06/21/2022   S/P bilateral breast reduction 06/04/2020   Anxiety 01/28/2012    Concerns addressed today: -Had pap done at [place] resulted negative, hx of abnormal pap smear with colposcopy biopsy.   Delivery Plans Plans to deliver at [facility]. Patient given information for [facility] Healthy Baby website for more information about [facility] and [place]. Patient is not interested in water birth. Offered upcoming OB visit with CNM to discuss further.  MyChart/Babyscripts MyChart access verified. I explained pt will have some visits in office and some virtually. Babyscripts instructions given and order placed. Patient verifies receipt of registration text/e-mail. Account successfully created and app downloaded.  Blood Pressure Cuff/Weight Scale Patient has private insurance; instructed to purchase  blood pressure cuff and bring to first prenatal appt. Explained after first prenatal appt pt will check weekly and document in 96.   Anatomy US Explained first scheduled [place] will be around 19 weeks. Anatomy US scheduled for 08/28/2022 at 10:30 AM. Pt notified to arrive at 10:15AM.  Labs Discussed Natera genetic screening with patient. Would like both Panorama and Horizon drawn at new OB visit. Routine prenatal labs needed.  Covid Vaccine Patient has covid vaccine.   Is patient a CenteringPregnancy candidate?  Declined Declined due to Schedule/Times    Is patient a Mom+Baby Combined Care candidate?  Declined  Patient stated she lives in [place]. Will be too far for her to travel.   Social Determinants of Health Food Insecurity: Patient denies food insecurity. WIC Referral: Patient is interested in referral to [facility].  Transportation: Patient denies transportation needs. Childcare: Discussed no children allowed at ultrasound appointments. Offered childcare services; patient declines childcare services at this time.  First visit review I reviewed new OB appt with pt. I explained she will have a provider visit that includes initial ob labs, genetic screening, and pelvic exam. Explained pt will be seen by Shelda Pal DO at first visit; encounter routed to appropriate provider. Explained that patient will be seen by pregnancy navigator following visit with provider.   Mariane Baumgarten, [place] 06/21/2022  2:32 PM

## 2022-07-10 ENCOUNTER — Ambulatory Visit (INDEPENDENT_AMBULATORY_CARE_PROVIDER_SITE_OTHER): Payer: BC Managed Care – PPO | Admitting: Family Medicine

## 2022-07-10 ENCOUNTER — Other Ambulatory Visit: Payer: Self-pay

## 2022-07-10 VITALS — BP 131/85 | HR 85 | Wt 203.0 lb

## 2022-07-10 DIAGNOSIS — Z31430 Encounter of female for testing for genetic disease carrier status for procreative management: Secondary | ICD-10-CM | POA: Diagnosis not present

## 2022-07-10 DIAGNOSIS — Z3480 Encounter for supervision of other normal pregnancy, unspecified trimester: Secondary | ICD-10-CM | POA: Diagnosis not present

## 2022-07-10 DIAGNOSIS — O219 Vomiting of pregnancy, unspecified: Secondary | ICD-10-CM

## 2022-07-10 DIAGNOSIS — Z3A12 12 weeks gestation of pregnancy: Secondary | ICD-10-CM

## 2022-07-10 DIAGNOSIS — Z3481 Encounter for supervision of other normal pregnancy, first trimester: Secondary | ICD-10-CM

## 2022-07-10 DIAGNOSIS — Z3401 Encounter for supervision of normal first pregnancy, first trimester: Secondary | ICD-10-CM | POA: Diagnosis not present

## 2022-07-10 MED ORDER — ASPIRIN 81 MG PO TBEC: 81.0000 mg | DELAYED_RELEASE_TABLET | Freq: Every day | ORAL | 0 refills | Status: AC

## 2022-07-10 MED ORDER — DOXYLAMINE-PYRIDOXINE 10-10 MG PO TBEC: DELAYED_RELEASE_TABLET | ORAL | 1 refills | Status: DC

## 2022-07-10 NOTE — Patient Instructions (Signed)

## 2022-07-10 NOTE — Progress Notes (Signed)
PRENATAL VISIT NOTE  Subjective:  Jessica Rhodes is a 31 y.o. G1P0 at [redacted]w[redacted]d being seen today for her first prenatal visit for this pregnancy.  She is currently monitored for the following issues for this high-risk pregnancy and has Anxiety; S/P bilateral breast reduction; Supervision of other normal pregnancy, antepartum; ADHD; and History of abnormal cervical Pap smear on their problem list.  Patient reports heartburn and nausea. Contractions: Not present. Vag. Bleeding: None.  Movement: Absent. Denies leaking of fluid.   She is planning to  breast and bottle . Desires nothing for contraception.   The following portions of the patient's history were reviewed and updated as appropriate: allergies, current medications, past family history, past medical history, past social history, past surgical history and problem list.   Objective:   Vitals:   07/10/22 1432  BP: 131/85  Pulse: 85  Weight: 203 lb (92.1 kg)    Fetal Status: Fetal Heart Rate (bpm): 155   Movement: Absent     General:  Alert, oriented and cooperative. Patient is in no acute distress.  Skin: Skin is warm and dry. No rash noted.   Cardiovascular: Normal heart rate and rhythm noted  Respiratory: Normal respiratory effort, no problems with respiration noted. Clear to auscultation.   Abdomen: Soft, gravid, appropriate for gestational age. Normal bowel sounds. Non-tender. Pain/Pressure: Absent     Pelvic: {Blank single:19197::"Cervical exam performed","Cervical exam deferred"}       Normal cervical contour, no lesions, no bleeding following pap, normal discharge  Extremities: Normal range of motion.     Mental Status: Normal mood and affect. Normal behavior. Normal judgment and thought content.    Indications for ASA therapy (per uptodate) One of the following: Previous pregnancy with preeclampsia, especially early onset and with an adverse outcome No Multifetal gestation No Chronic hypertension No Type 1 or 2  diabetes mellitus No Chronic kidney disease No Autoimmune disease (antiphospholipid syndrome, systemic lupus erythematosus) No  Two or more of the following: Nulliparity Yes Obesity (body mass index >30 kg/m2) Yes Family history of preeclampsia in mother or sister Yes Age ?35 years No Sociodemographic characteristics (African American race, low socioeconomic level) No Personal risk factors (eg, previous pregnancy with low birth weight or small for gestational age infant, previous adverse pregnancy outcome [eg, stillbirth], interval >10 years between pregnancies) No  Indications for early GDM screening  First-degree relative with diabetes Yes BMI >30kg/m2 Yes Age > 35 No Previous birth of an infant weighing ?4000 g No Gestational diabetes mellitus in a previous pregnancy No Glycated hemoglobin ?5.7 percent (39 mmol/mol), impaired glucose tolerance, or impaired fasting glucose on previous testing No High-risk race/ethnicity (eg, African American, Latino, Native American, [place] American, Pacific Islander) No Previous stillbirth of unknown cause No Maternal birthweight > 9 lbs No History of cardiovascular disease No Hypertension or on therapy for hypertension No High-density lipoprotein cholesterol level <35 mg/dL (0.90 mmol/L) and/or a triglyceride level >250 mg/dL (2.82 mmol/L) No Polycystic ovary syndrome No Physical inactivity No Other clinical condition associated with insulin resistance (eg, severe obesity, acanthosis nigricans) No Current use of glucocorticoids No   Early screening tests: FBS, A1C, Random CBG, glucose challenge  Assessment and Plan:  Pregnancy: G1P0 at [redacted]w[redacted]d  1. Supervision of other normal pregnancy, antepartum - HgB A1c - Culture, OB Urine - CBC/D/Plt+RPR+Rh+ABO+RubIgG...  2. Nausea and vomiting during pregnancy - aspirin EC 81 MG tablet; Take 1 tablet (81 mg total) by mouth daily. Swallow whole.  Dispense: 90 tablet; Refill: 0 -  Doxylamine-Pyridoxine  (DICLEGIS) 10-10 MG TBEC; Take 2 tabs at bedtime. If needed, add another tab in the morning. If needed, add another tab in the afternoon, up to 4 tabs/day.  Dispense: 100 tablet; Refill: 1    Preterm labor/first trimester warning symptoms and general obstetric precautions including but not limited to vaginal bleeding, contractions, leaking of fluid and fetal movement were reviewed in detail with the patient. Please refer to After Visit Summary for other counseling recommendations.   Discussed the normal visit cadence for prenatal care Discussed the nature of our practice with multiple providers including residents and students   No follow-ups on file.  Future Appointments  Date Time Provider [place]  08/07/2022  3:55 PM Stormy Card, MD [facility]  08/28/2022 10:15 AM [facility] NURSE [facility]  08/28/2022 10:30 AM [facility] US3 [facility] [place], DO [facility] Fellow, Faculty practice [place] for [facility] 07/11/22  1:25 PM

## 2022-07-10 NOTE — Progress Notes (Signed)
Last Pap- 05/2022

## 2022-07-11 LAB — CBC/D/PLT+RPR+RH+ABO+RUBIGG...
Antibody Screen: NEGATIVE
Basophils Absolute: 0.1 10*3/uL (ref 0.0–0.2)
Basos: 1 %
EOS (ABSOLUTE): 0.3 10*3/uL (ref 0.0–0.4)
Eos: 3 %
HCV Ab: NONREACTIVE
HIV Screen 4th Generation wRfx: NONREACTIVE
Hematocrit: 38.3 % (ref 34.0–46.6)
Hemoglobin: 12.7 g/dL (ref 11.1–15.9)
Hepatitis B Surface Ag: NEGATIVE
Immature Grans (Abs): 0 10*3/uL (ref 0.0–0.1)
Immature Granulocytes: 0 %
Lymphocytes Absolute: 2.3 10*3/uL (ref 0.7–3.1)
Lymphs: 20 %
MCH: 29.7 pg (ref 26.6–33.0)
MCHC: 33.2 g/dL (ref 31.5–35.7)
MCV: 90 fL (ref 79–97)
Monocytes Absolute: 0.6 10*3/uL (ref 0.1–0.9)
Monocytes: 5 %
Neutrophils Absolute: 8.4 10*3/uL — ABNORMAL HIGH (ref 1.4–7.0)
Neutrophils: 71 %
Platelets: 334 10*3/uL (ref 150–450)
RBC: 4.28 x10E6/uL (ref 3.77–5.28)
RDW: 12.7 % (ref 11.7–15.4)
RPR Ser Ql: NONREACTIVE
Rh Factor: POSITIVE
Rubella Antibodies, IGG: 4.64 index (ref >0.99)
WBC: 11.7 10*3/uL — ABNORMAL HIGH (ref 3.4–10.8)

## 2022-07-11 LAB — HEMOGLOBIN A1C
Est. average glucose Bld gHb Est-mCnc: 94 mg/dL
Hgb A1c MFr Bld: 4.9 % (ref 4.8–5.6)

## 2022-07-11 LAB — HCV INTERPRETATION

## 2022-07-12 LAB — CULTURE, OB URINE

## 2022-07-12 LAB — URINE CULTURE, OB REFLEX

## 2022-07-15 LAB — PANORAMA PRENATAL TEST FULL PANEL:PANORAMA TEST PLUS 5 ADDITIONAL MICRODELETIONS: FETAL FRACTION: 4.1

## 2022-07-16 LAB — HORIZON CUSTOM: REPORT SUMMARY: NEGATIVE

## 2022-08-07 ENCOUNTER — Ambulatory Visit (INDEPENDENT_AMBULATORY_CARE_PROVIDER_SITE_OTHER): Payer: BC Managed Care – PPO | Admitting: Family Medicine

## 2022-08-07 ENCOUNTER — Other Ambulatory Visit: Payer: Self-pay

## 2022-08-07 VITALS — BP 112/73 | HR 80 | Wt 203.9 lb

## 2022-08-07 DIAGNOSIS — Z3480 Encounter for supervision of other normal pregnancy, unspecified trimester: Secondary | ICD-10-CM

## 2022-08-07 DIAGNOSIS — Z3A16 16 weeks gestation of pregnancy: Secondary | ICD-10-CM

## 2022-08-07 DIAGNOSIS — Z23 Encounter for immunization: Secondary | ICD-10-CM | POA: Diagnosis not present

## 2022-08-07 DIAGNOSIS — Q5128 Other doubling of uterus, other specified: Secondary | ICD-10-CM | POA: Insufficient documentation

## 2022-08-07 DIAGNOSIS — Z3482 Encounter for supervision of other normal pregnancy, second trimester: Secondary | ICD-10-CM

## 2022-08-07 DIAGNOSIS — O34592 Maternal care for other abnormalities of gravid uterus, second trimester: Secondary | ICD-10-CM

## 2022-08-07 NOTE — Progress Notes (Signed)
   PRENATAL VISIT NOTE  Subjective:  Jessica Rhodes is a 31 y.o. G1P0 at [redacted]w[redacted]d being seen today for ongoing prenatal care.  She is currently monitored for the following issues for this low-risk pregnancy and has Anxiety; S/P bilateral breast reduction; Supervision of other normal pregnancy, antepartum; ADHD; and History of abnormal cervical Pap smear on their problem list.  Patient reports no complaints.  Contractions: Not present. Vag. Bleeding: None.  Movement: Absent. Denies leaking of fluid.   The following portions of the patient's history were reviewed and updated as appropriate: allergies, current medications, past family history, past medical history, past social history, past surgical history and problem list.   Objective:   Vitals:   08/07/22 1626  BP: 112/73  Pulse: 80  Weight: 203 lb 14.4 oz (92.5 kg)    Fetal Status:   Fundal Height: 148 cm Movement: Absent     General:  Alert, oriented and cooperative. Patient is in no acute distress.  Skin: Skin is warm and dry. No rash noted.   Cardiovascular: Normal heart rate noted  Respiratory: Normal respiratory effort, no problems with respiration noted  Abdomen: Soft, gravid, appropriate for gestational age.  Pain/Pressure: Absent     Pelvic: Cervical exam deferred        Extremities: Normal range of motion.  Edema: None  Mental Status: Normal mood and affect. Normal behavior. Normal judgment and thought content.   Assessment and Plan:  Pregnancy: G1P0 at [redacted]w[redacted]d 1. Supervision of other normal pregnancy, antepartum Doing well overall, no concerns. AFP today.  - AFP, Serum, Open Spina Bifida  3. Need for immunization against influenza - Flu Vaccine QUAD 17mo+IM (Fluarix, Fluzone & Alfiuria Quad PF)  4. [redacted] weeks gestation of pregnancy Anatomy scan scheduled.  Preterm labor symptoms and general obstetric precautions including but not limited to vaginal bleeding, contractions, leaking of fluid and fetal movement were  reviewed in detail with the patient. Please refer to After Visit Summary for other counseling recommendations.    Future Appointments  Date Time Provider Department Center  08/28/2022 10:15 AM [facility] NURSE [facility]  08/28/2022 10:30 AM [facility] US3 [facility]    Klaire Court Lizabeth Leyden, MD

## 2022-08-09 LAB — AFP, SERUM, OPEN SPINA BIFIDA
AFP MoM: 1.19
AFP Value: 33.4 ng/mL
Gest. Age on Collection Date: 16 weeks
Maternal Age At EDD: 32.8 yr
OSBR Risk 1 IN: 6704
Test Results:: NEGATIVE
Weight: 203 [lb_av]

## 2022-08-28 ENCOUNTER — Encounter: Payer: Self-pay | Admitting: *Deleted

## 2022-08-28 ENCOUNTER — Ambulatory Visit (HOSPITAL_BASED_OUTPATIENT_CLINIC_OR_DEPARTMENT_OTHER): Payer: BC Managed Care – PPO | Admitting: Maternal & Fetal Medicine

## 2022-08-28 ENCOUNTER — Ambulatory Visit: Payer: BC Managed Care – PPO | Attending: Family Medicine

## 2022-08-28 ENCOUNTER — Ambulatory Visit: Payer: BC Managed Care – PPO | Admitting: *Deleted

## 2022-08-28 VITALS — BP 124/69 | HR 74

## 2022-08-28 DIAGNOSIS — Z3480 Encounter for supervision of other normal pregnancy, unspecified trimester: Secondary | ICD-10-CM | POA: Diagnosis not present

## 2022-08-28 DIAGNOSIS — Z3A19 19 weeks gestation of pregnancy: Secondary | ICD-10-CM | POA: Diagnosis not present

## 2022-08-28 DIAGNOSIS — Z3689 Encounter for other specified antenatal screening: Secondary | ICD-10-CM | POA: Diagnosis not present

## 2022-08-28 DIAGNOSIS — Z363 Encounter for antenatal screening for malformations: Secondary | ICD-10-CM | POA: Insufficient documentation

## 2022-08-28 DIAGNOSIS — O99212 Obesity complicating pregnancy, second trimester: Secondary | ICD-10-CM | POA: Insufficient documentation

## 2022-08-28 DIAGNOSIS — Q5128 Other doubling of uterus, other specified: Secondary | ICD-10-CM | POA: Diagnosis not present

## 2022-08-28 DIAGNOSIS — O3402 Maternal care for unspecified congenital malformation of uterus, second trimester: Secondary | ICD-10-CM | POA: Insufficient documentation

## 2022-08-29 NOTE — Progress Notes (Signed)
MFM Consult Note Patient Name: Jessica Rhodes  Patient MRN:   [id number]  Referring provider: [facility]  Reason for Consult: Septate uterus   HPI: Jessica Rhodes is a 31 y.o. G1P0 at [redacted]w[redacted]d here for ultrasound and consultation.   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  The fetus is on the the left of the septum.  There is an indentation of uterine tissue that appears to go part way down the inside of the uterine cavity but does not touch the cervix.  It is difficult to appreciate the external contour of the uterus due to the gestational age.  However, there was a previous CT scan that showed there was a septate uterus as well.  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  I encouraged the patient to discuss this with her OB provider and have a vaginal exam to assess for the presence of 1 or 2 cervix's.  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  I discussed that the placenta is at least partly implanted on the septum and this could increase the risk of fetal growth abnormalities, preterm birth, malpresentation as well as stillbirth.  Also discussed the coexisting association of maternal renal abnormalities with uterine anomalies.  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 We also discussed the increased risk of preeclampsia and malpresentation. I discussed the antenatal course going forward in her pregnancy with all of her questions asked.  Review of Systems: A review of systems was performed and was negative except per HPI   Vitals and Physical Exam See intake sheet for vitals Sitting comfortably on the sonogram table Nonlabored breathing Normal rate and rhythm Abdomen is nontender  Genetic testing: low risk female  Sonographic findings Single intrauterine pregnancy. Observed fetal cardiac activity. Cephalic presentation. Fetal anatomy that was well seen  appears normal without evidence of soft markers. Not all fetal structures were well seen due to a technically diffcult exam and the anatomic survey remains incomplete with limited views of the [facility] and OFTs.  Fetal biometry shows the estimated fetal weight at the 10 percentile.  Amniotic fluid volume: Within normal limits. Placenta: Anterior. Cervix: Normal appearance by transabdominal scan with a cervical length of 3.3 cm. 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  The fetus is in the left of the septum.  There is an indentation of uterine tissue that appears to go part way down the inside of the uterine cavity but does not touch the cervix.  It is difficult to appreciate the external contour of the uterus due to the gestational age. The placenta is at least partly implanted on the septum.   Assessment - Uterine septum Plan - Growth [place] every 4 weeks - Antenatal testing starting around 34-36 weeks - ASA for preE ppx - Delivery per typical OB indications   I spent 45 minutes reviewing the patients chart, including labs and images as well as counseling the patient about her medical conditions.  Braxton Feathers  MFM, [facility]   08/29/2022  4:52 PM

## 2022-08-30 ENCOUNTER — Other Ambulatory Visit: Payer: Self-pay | Admitting: *Deleted

## 2022-08-30 DIAGNOSIS — O3402 Maternal care for unspecified congenital malformation of uterus, second trimester: Secondary | ICD-10-CM

## 2022-08-30 DIAGNOSIS — Q5128 Other doubling of uterus, other specified: Secondary | ICD-10-CM

## 2022-08-30 DIAGNOSIS — Z362 Encounter for other antenatal screening follow-up: Secondary | ICD-10-CM

## 2022-08-31 ENCOUNTER — Encounter: Payer: Self-pay | Admitting: Family Medicine

## 2022-08-31 DIAGNOSIS — O3400 Maternal care for unspecified congenital malformation of uterus, unspecified trimester: Secondary | ICD-10-CM | POA: Insufficient documentation

## 2022-09-11 NOTE — L&D Delivery Note (Signed)
OB/GYN Faculty Practice Operative Vaginal Delivery Note  Jessica Rhodes is a 32 y.o. G1P0 s/p VAVD at [redacted]w[redacted]d. She was admitted for IOL 2/2 poorly controlled A2GDM.   ROM: 11h 55m with clear fluid GBS Status:  Negative/-- (04/18 0803) Maximum Maternal Temperature: 99.1  Labor Progress: Initial SVE: 1.5/60,70/-2. She then progressed to complete.   Delivery Date/Time: 01/08/2023 @0157   Delivery: Called to room and patient was complete and pushing.   Indication for operative vaginal delivery: Decreasing maternal effort and improvement in station  Patient was examined and found to be fully dilated with fetal station of +2.  Patient's foley catheter was noted to be in place, and there were no known fetal contraindications to operative vaginal delivery.   Risks of vacuum assistance were discussed in detail, including but not limited to, bleeding, infection, damage to maternal tissues, fetal cephalohematoma, inability to effect vaginal delivery of the head or shoulder dystocia that cannot be resolved by established maneuvers and need for emergency cesarean section.  Patient gave verbal consent.  The soft vacuum cup was positioned over the sagittal suture 3 cm anterior to posterior fontanelle.  Pressure was then increased to 500 mmHg, and the patient was instructed to push.  Pulling was administered along the pelvic curve while patient was pushing; there was 1 contraction and 0 popoffs.  Vacuum was reduced in between contractions.   Head delivered LOA. No nuchal cord present. There was a left compound hand. Shoulder and body delivered in usual fashion. Infant with spontaneous cry, placed on mother's abdomen, dried and stimulated. Cord clamped x 2 after 1-minute delay, and cut by the provider. Taken over to warmer for further assessment due to poor effort to continue spontaneous cry. Cord blood drawn. Placenta delivered after 10mL of pitocin was injected into the cord and with gentle cord traction.  Fundus firm with massage and Pitocin. Labia, perineum, vagina, and cervix inspected. There was a 2nd degree laceration that was repaired with good hemostasis.  Baby Weight: pending Placenta: 3 vessel, intact. Sent to pathology Complications: VAVD. Adherent placenta. Pitocin inj 10mL x1. Methergin series.  Lacerations: 2nd degree; 2.0 vicryl EBL: 227 mL Analgesia: Epidural, lidocaine   Infant:  APGAR (1 MIN): 7   APGAR (5 MINS): 9    [name], DO OB Fellow, Faculty [facility] [employer] 01/08/2023, 3:29 AM

## 2022-09-13 ENCOUNTER — Other Ambulatory Visit: Payer: Self-pay

## 2022-09-13 ENCOUNTER — Ambulatory Visit (INDEPENDENT_AMBULATORY_CARE_PROVIDER_SITE_OTHER): Payer: PRIVATE HEALTH INSURANCE | Admitting: Certified Nurse Midwife

## 2022-09-13 VITALS — BP 120/69 | HR 83 | Wt 209.3 lb

## 2022-09-13 DIAGNOSIS — Z3A21 21 weeks gestation of pregnancy: Secondary | ICD-10-CM

## 2022-09-13 DIAGNOSIS — O3402 Maternal care for unspecified congenital malformation of uterus, second trimester: Secondary | ICD-10-CM

## 2022-09-13 DIAGNOSIS — O0992 Supervision of high risk pregnancy, unspecified, second trimester: Secondary | ICD-10-CM

## 2022-09-13 DIAGNOSIS — Q5128 Other doubling of uterus, other specified: Secondary | ICD-10-CM

## 2022-09-13 DIAGNOSIS — M25552 Pain in left hip: Secondary | ICD-10-CM

## 2022-09-13 DIAGNOSIS — M25551 Pain in right hip: Secondary | ICD-10-CM

## 2022-09-13 MED ORDER — MAG-OXIDE 200 MG PO TABS: 400.0000 mg | ORAL_TABLET | Freq: Every day | ORAL | 3 refills | Status: DC

## 2022-09-13 NOTE — Progress Notes (Signed)
   PRENATAL VISIT NOTE  Subjective:  Jessica Rhodes is a 32 y.o. G1P0 at [redacted]w[redacted]d being seen today for ongoing prenatal care.  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  Patient reports  having some hip pain, is ongoing but worsening with pregnancy .  Contractions: Not present. Vag. Bleeding: None.  Movement: Present. Denies leaking of fluid.   The following portions of the patient's history were reviewed and updated as appropriate: allergies, current medications, past family history, past medical history, past social history, past surgical history and problem list.   Objective:   Vitals:   09/13/22 0833  BP: 120/69  Pulse: 83  Weight: 209 lb 4.8 oz (94.9 kg)   Fetal Status: Fetal Heart Rate (bpm): 148   Movement: Present     General:  Alert, oriented and cooperative. Patient is in no acute distress.  Skin: Skin is warm and dry. No rash noted.   Cardiovascular: Normal heart rate noted  Respiratory: Normal respiratory effort, no problems with respiration noted  Abdomen: Soft, gravid, appropriate for gestational age.  Pain/Pressure: Present     Pelvic: Cervical exam performed in the presence of a chaperone. Single cervix visualized with speculum and felt during bimanual        Extremities: Normal range of motion.  Edema: None  Mental Status: Normal mood and affect. Normal behavior. Normal judgment and thought content.   Assessment and Plan:  Pregnancy: G1P0 at [redacted]w[redacted]d 1. Supervision of high risk pregnancy in second trimester - Doing well, feeling regular and vigorous fetal movement   2. [redacted] weeks gestation of pregnancy - Routine OB care including anticipatory guidance re cadence of future visits  3. Septate  uterus affecting pregnancy in second trimester - Single cervix visualized/felt, counseled patient that the septum may just end prior to the cervical os therefore not duplicating the cervix.  4. Bilateral hip pain - Ambulatory referral to Physical Therapy,  - Magnesium Oxide -Mg Supplement (MAG-OXIDE) 200 MG TABS; Take 2 tablets (400 mg total) by mouth at bedtime. If that amount causes loose stools in the am, switch to 200mg  daily at bedtime.  Dispense: 60 tablet; Refill: 3  Preterm labor symptoms and general obstetric precautions including but not limited to vaginal bleeding, contractions, leaking of fluid and fetal movement were reviewed in detail with the patient. Please refer to After Visit Summary for other counseling recommendations.   Return in about 4 weeks (around 10/11/2022) for IN-PERSON, LOB.  Future Appointments  Date Time Provider [place]  09/15/2022 10:45 AM [facility] NURSE [facility]  09/15/2022 11:00 AM [facility] US6 [facility]   Gabriel Carina, CNM

## 2022-09-13 NOTE — Patient Instructions (Signed)
Jessica Rhodes w/ [facility] [address] [place], [place] [address] [phone] [email or web address]  

## 2022-09-15 ENCOUNTER — Other Ambulatory Visit: Payer: Self-pay | Admitting: *Deleted

## 2022-09-15 ENCOUNTER — Ambulatory Visit: Payer: BC Managed Care – PPO | Admitting: *Deleted

## 2022-09-15 ENCOUNTER — Ambulatory Visit: Payer: BC Managed Care – PPO | Attending: Obstetrics

## 2022-09-15 VITALS — BP 130/73 | HR 74

## 2022-09-15 DIAGNOSIS — Z3A21 21 weeks gestation of pregnancy: Secondary | ICD-10-CM

## 2022-09-15 DIAGNOSIS — O3402 Maternal care for unspecified congenital malformation of uterus, second trimester: Secondary | ICD-10-CM | POA: Insufficient documentation

## 2022-09-15 DIAGNOSIS — Q5128 Other doubling of uterus, other specified: Secondary | ICD-10-CM

## 2022-09-15 DIAGNOSIS — O0992 Supervision of high risk pregnancy, unspecified, second trimester: Secondary | ICD-10-CM

## 2022-09-15 DIAGNOSIS — E669 Obesity, unspecified: Secondary | ICD-10-CM | POA: Diagnosis not present

## 2022-09-15 DIAGNOSIS — O99212 Obesity complicating pregnancy, second trimester: Secondary | ICD-10-CM

## 2022-09-15 DIAGNOSIS — O34592 Maternal care for other abnormalities of gravid uterus, second trimester: Secondary | ICD-10-CM

## 2022-09-15 DIAGNOSIS — Z362 Encounter for other antenatal screening follow-up: Secondary | ICD-10-CM | POA: Diagnosis not present

## 2022-09-20 ENCOUNTER — Other Ambulatory Visit: Payer: Self-pay | Admitting: *Deleted

## 2022-09-20 DIAGNOSIS — Q5128 Other doubling of uterus, other specified: Secondary | ICD-10-CM

## 2022-09-20 DIAGNOSIS — O99212 Obesity complicating pregnancy, second trimester: Secondary | ICD-10-CM

## 2022-09-21 NOTE — Therapy (Signed)
OUTPATIENT PHYSICAL THERAPY LOWER EXTREMITY EVALUATION   Patient Name: Jessica Rhodes MRN: [id number] DOB:1990/11/25, 32 y.o., female Today's Date: 09/22/2022  END OF SESSION:  PT End of Session - 09/22/22 1510     Visit Number 1    Date for PT Re-Evaluation 11/03/22    Authorization Type BCBS    Authorization Time Period 09/22/22 to 11/03/22    PT Start Time 0931    PT Stop Time 1015    PT Time Calculation (min) 44 min    Activity Tolerance No increased pain;Patient tolerated treatment well    Behavior During Therapy [facility] for tasks assessed/performed             Past Medical History:  Diagnosis Date   Allergy    Anxiety    Past Surgical History:  Procedure Laterality Date   BREAST REDUCTION SURGERY Bilateral 05/05/2020   Procedure: MAMMARY REDUCTION  (BREAST);  Surgeon: Cindra Presume, MD;  Location: [place];  Service: Plastics;  Laterality: Bilateral;   TONSILECTOMY/ADENOIDECTOMY WITH MYRINGOTOMY     Patient Active Problem List   Diagnosis Date Noted   Septate uterus affecting pregnancy 08/31/2022   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 08/07/2022   Supervision of high risk pregnancy in second trimester 06/21/2022   ADHD 06/21/2022   S/P bilateral breast reduction 06/04/2020   Anxiety 01/28/2012    PCP: Donald Prose, MD  REFERRING PROVIDER: Gaylan Gerold, CNM  REFERRING DIAG: Bilateral hip pain  THERAPY DIAG:  Pain in left hip - Plan: PT plan of care cert/re-cert  Pain in right hip - Plan: PT plan of care cert/re-cert  Other muscle spasm - Plan: PT plan of care cert/re-cert  Abnormal posture - Plan: PT plan of care cert/re-cert  Rationale for Evaluation and Treatment: Rehabilitation  ONSET DATE: ~1.5 months ago  SUBJECTIVE:   SUBJECTIVE STATEMENT: Pt states that her hips have been bothering her fairly consistently about 1.5 mo ago. If she is up on her feet all day then she can hardly get up off the  couch at the end of the day. She feels that it is about the same since she the onset. She works from home 50% of the time and when at home she can change her position throughout the day, but when at work she is more sore. She has pain with sleep, especially on the side against the bed.   PERTINENT HISTORY: [redacted] weeks pregnant PAIN:  Are you having pain? No  PRECAUTIONS: Other: Pregnant  WEIGHT BEARING RESTRICTIONS: No  FALLS:  Has patient fallen in last 6 months? 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  PLOF: Independent  PATIENT GOALS: decrease pain in hips with daily activity  NEXT MD VISIT:   OBJECTIVE:   DIAGNOSTIC FINDINGS:   PATIENT SURVEYS:    COGNITION: Overall cognitive status: Within functional limits for tasks assessed     SENSATION: pt denies N/T Not tested   MUSCLE LENGTH: Hamstrings: Right WNL deg; Left WNL deg Marcello Moores test: Right not tested deg; Left not tested deg  POSTURE: increased lumbar lordosis with anterior pelvic tilt  PALPATION: Tenderness and muscle spasm in bilateral glute med, proximal glute max  LOWER EXTREMITY ROM: WNL all directions except hip extension limited to neutral  Passive ROM Right eval Left eval  Hip flexion    Hip extension    Hip abduction    Hip adduction    Hip internal rotation    Hip external rotation    Knee flexion    Knee extension    Ankle dorsiflexion    Ankle plantarflexion    Ankle inversion    Ankle eversion     (Blank rows = not tested)  LOWER EXTREMITY MMT:  MMT Right eval Left eval  Hip flexion    Hip extension sidelying 3+ 3+  Hip abduction 3+ 3+  Hip adduction    Hip internal rotation    Hip external rotation    Knee flexion    Knee extension    Ankle dorsiflexion    Ankle plantarflexion    Ankle inversion    Ankle  eversion     (Blank rows = not tested)  LOWER EXTREMITY SPECIAL TESTS:    FUNCTIONAL TESTS:  Squats onto toes, quadriceps dominant  GAIT: Pt walking 10-15 minutes intermittently for exercise   TODAY'S TREATMENT:                                                                                                                              DATE:  09/25/22 Addaday Lt and Rt gluteals x34min Sleep positions   Long sitting hip rotation Rt/Lt in 90/90 position   PATIENT EDUCATION:  Education details: eval findings Person educated: Patient Education method: Explanation and Handouts Education comprehension: verbalized understanding and returned demonstration  HOME EXERCISE PROGRAM: Access Code: [email or web address] URL: [email or web address] Date: 09/22/2022 Prepared by: [facility] - [facility]  Exercises - Supine Hip Internal and External Rotation  - 3 x daily - 7 x weekly - 1 sets - 10 reps  ASSESSMENT:  CLINICAL IMPRESSION: Patient is a 32 y.o. F currently [redacted] weeks pregnant, who was seen today for physical therapy evaluation and treatment for bilateral hip pain and stiffness. Pain is worse with prolonged standing activity. Pain is present at night and she find it difficult to get comfortable.  Patient is currently only walking 10 to 15 minutes intermittently throughout the week.  She has LE ROM that is WNL.  Her greatest limitation is with hip strength.  She notes that she often stands with her glutes clinched also indicating trunk weakness as expected with pregnancy.  She would benefit from skilled PT to address her core weakness, educate on proper body mechanics and posture, and safely facilitate a regular walking/exercise program leading up to delivery.  OBJECTIVE IMPAIRMENTS: decreased activity tolerance, decreased endurance, difficulty walking, decreased strength, increased muscle spasms, improper body mechanics, postural dysfunction, and pain.    ACTIVITY LIMITATIONS: bending, standing, squatting, sleeping, and locomotion level  PARTICIPATION LIMITATIONS: community activity and occupation  PERSONAL FACTORS: Fitness and 1 comorbidity: pregnancy  are also affecting patient's functional outcome.   REHAB POTENTIAL: Good  CLINICAL DECISION MAKING: Stable/uncomplicated  EVALUATION COMPLEXITY: Low   GOALS: Goals reviewed with  patient? Yes  SHORT TERM GOALS: Target date: 09/29/22 Pt will be independent with initial HEP to improve strength and posture awareness  Baseline: Goal status: INITIAL  LONG TERM GOALS: Target date: 11/03/22  Pt will have improved glute strength to atleast 4/5 MMT. Baseline:  Goal status: INITIAL  2.  Pt will have improved LE endurance evident by her ability to go for 20 minute walk up to 5 days a week without significant increase in pain.  Baseline: 10-15 min sporadically Goal status: INITIAL  3.  Pt will report atleast 40% improvement in hip pain throughout the day. Baseline:  Goal status: INITIAL  4.  Pt will be able to describe 2 pushing positions with proper hip position, breathing and relaxation to prep for labor and delivery. Baseline:  Goal status: INITIAL     PLAN:  PT FREQUENCY: 1x/week  PT DURATION: 6 weeks  PLANNED INTERVENTIONS: Therapeutic exercises, Therapeutic activity, Neuromuscular re-education, Balance training, Gait training, Patient/Family education, Self Care, Joint mobilization, Taping, and Re-evaluation  PLAN FOR NEXT SESSION: f/u on walking and progress towards this goal as able, trunk and hip strength progression  3:23 PM,09/22/22 Sherol Dade PT, DPT [place] [facility] [place]

## 2022-09-22 ENCOUNTER — Encounter: Payer: Self-pay | Admitting: Physical Therapy

## 2022-09-22 ENCOUNTER — Ambulatory Visit: Payer: BC Managed Care – PPO | Attending: Certified Nurse Midwife | Admitting: Physical Therapy

## 2022-09-22 DIAGNOSIS — R293 Abnormal posture: Secondary | ICD-10-CM | POA: Insufficient documentation

## 2022-09-22 DIAGNOSIS — M62838 Other muscle spasm: Secondary | ICD-10-CM | POA: Diagnosis not present

## 2022-09-22 DIAGNOSIS — M25552 Pain in left hip: Secondary | ICD-10-CM | POA: Diagnosis not present

## 2022-09-22 DIAGNOSIS — M25551 Pain in right hip: Secondary | ICD-10-CM | POA: Insufficient documentation

## 2022-09-28 ENCOUNTER — Ambulatory Visit: Payer: BC Managed Care – PPO | Admitting: Physical Therapy

## 2022-10-02 ENCOUNTER — Ambulatory Visit
Admission: EM | Admit: 2022-10-02 | Discharge: 2022-10-02 | Disposition: A | Discharge status: Home / Self Care | Payer: BC Managed Care – PPO | Attending: Family Medicine | Admitting: Family Medicine

## 2022-10-02 ENCOUNTER — Ambulatory Visit
Admit: 2022-10-02 | Discharge: 2022-10-02 | Disposition: A | Discharge status: Home / Self Care | Payer: BC Managed Care – PPO

## 2022-10-02 ENCOUNTER — Other Ambulatory Visit: Payer: Self-pay

## 2022-10-02 DIAGNOSIS — J069 Acute upper respiratory infection, unspecified: Secondary | ICD-10-CM | POA: Insufficient documentation

## 2022-10-02 DIAGNOSIS — Z1152 Encounter for screening for COVID-19: Secondary | ICD-10-CM | POA: Insufficient documentation

## 2022-10-02 LAB — RESP PANEL BY RT-PCR (RSV, FLU A&B, COVID)  RVPGX2
Influenza A by PCR: NEGATIVE
Influenza B by PCR: NEGATIVE
Resp Syncytial Virus by PCR: NEGATIVE
SARS Coronavirus 2 by RT PCR: NEGATIVE

## 2022-10-02 MED ORDER — ALBUTEROL SULFATE HFA 108 (90 BASE) MCG/ACT IN AERS: 2.0000 | INHALATION_SPRAY | RESPIRATORY_TRACT | 0 refills | Status: DC | PRN

## 2022-10-02 NOTE — ED Provider Notes (Signed)
[facility]    CSN: [id number] Arrival date & time: 10/02/22  1450      History   Chief Complaint Chief Complaint  Patient presents with   Cough    HPI Jessica Rhodes is a 32 y.o. female.   HPI   Chosen presents for cough since last Tuesday. Cough is productive and worsening.  Took Robitussin for cough.  No fever, rhinorrhea and sinus congestion. Has been wheezing at night. She had a co-worker who had RSV that she as around right before her symptoms started. There has been shortness of breath, nausea, vomiting or diarrhea. She smoked for 10 years and vaped for 2 years then quit when she found out she was  pregnant in August.  No history of asthma.       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 08/07/2022   Supervision of high risk pregnancy in second trimester 06/21/2022   ADHD 06/21/2022   S/P bilateral breast reduction 06/04/2020   Anxiety 01/28/2012    Past Surgical History:  Procedure Laterality Date   BREAST REDUCTION SURGERY Bilateral 05/05/2020   Procedure: MAMMARY REDUCTION  (BREAST);  Surgeon: Cindra Presume, MD;  Location: [place];  Service: Plastics;  Laterality: Bilateral;   TONSILECTOMY/ADENOIDECTOMY WITH MYRINGOTOMY      OB History     Gravida  1   Para      Term      Preterm      AB      Living         SAB      IAB      Ectopic      Multiple      Live Births               Home Medications    Prior to Admission medications   Medication Sig Start Date End Date Taking? Authorizing Provider  albuterol (VENTOLIN HFA) 108 (90 Base) MCG/ACT inhaler Inhale 2 puffs into the lungs every 4 (four) hours as needed. 10/02/22  Yes Marquelle Musgrave, Ronnette Juniper, DO  acetaminophen (TYLENOL) 500 MG tablet Take 1,000 mg by  mouth every 6 (six) hours as needed.    [provider]  aspirin EC 81 MG tablet Take 1 tablet (81 mg total) by mouth daily. Swallow whole. 07/10/22 10/08/22  Mercado-Ortiz, Ernestine Conrad, DO  escitalopram (LEXAPRO) 20 MG tablet Take 20 mg by mouth daily. 05/08/22   [provider]  LORazepam (ATIVAN) 2 MG tablet Take by mouth as needed.    [provider]  Magnesium Oxide -Mg Supplement (MAG-OXIDE) 200 MG TABS Take 2 tablets (400 mg total) by mouth at bedtime. If that amount causes loose stools in the am, switch to 200mg  daily at bedtime. 09/13/22   Gabriel Carina, CNM  Prenatal Vit-Fe Fumarate-FA (PRENATAL MULTIVITAMIN) TABS tablet Take 1 tablet by mouth daily at 12 noon.    [provider]    Family History Family History  Problem Relation Age of Onset   Cancer Paternal Grandmother    Heart disease Paternal Grandfather    Diabetes Paternal Grandfather     Social History Social History   Tobacco Use   Smoking status: Former    Types: Cigarettes    Quit date: 09/26/2019  Years since quitting: 3.0   Smokeless tobacco: Never  Vaping Use   Vaping Use: Former   Substances: Nicotine  Substance Use Topics   Alcohol use: Not Currently    Comment: socially   Drug use: No     Allergies   Patient has no known allergies.   Review of Systems Review of Systems: negative unless otherwise stated in HPI.      Physical Exam Triage Vital Signs ED Triage Vitals  Enc Vitals Group     BP 10/02/22 1609 130/77     Pulse Rate 10/02/22 1609 80     Resp 10/02/22 1609 18     Temp 10/02/22 1609 98.2 F (36.8 C)     Temp Source 10/02/22 1609 Oral     SpO2 10/02/22 1609 99 %     Weight 10/02/22 1610 210 lb (95.3 kg)     Height 10/02/22 1610 5\' 3"  (1.6 m)     Head Circumference --      Peak Flow --      Pain Score 10/02/22 1610 3     Pain Loc --      Pain Edu? --      Excl. in GC? --    No data found.  Updated Vital Signs BP 130/77 (BP Location:  Left Arm)   Pulse 80   Temp 98.2 F (36.8 C) (Oral)   Resp 18   Ht 5\' 3"  (1.6 m)   Wt 95.3 kg   LMP 04/16/2022 (Exact Date)   SpO2 99%   BMI 37.20 kg/m   Visual Acuity Right Eye Distance:   Left Eye Distance:   Bilateral Distance:    Right Eye Near:   Left Eye Near:    Bilateral Near:     Physical Exam GEN:     alert, non-toxic appearing female in no distress    HENT:  mucus membranes moist, no nasal discharge EYES:   pupils equal and reactive, no scleral injection or discharge NECK:  normal ROM, no meningismus   RESP:  no increased work of breathing, faint expiratory wheezing right mid to lower lung CVS:   regular rate and rhythm Skin:   warm and dry    UC Treatments / Results  Labs (all labs ordered are listed, but only abnormal results are displayed) Labs Reviewed  RESP PANEL BY RT-PCR (RSV, FLU A&B, COVID)  [id number]    EKG   Radiology No results found.  Procedures Procedures (including critical care time)  Medications Ordered in UC Medications - No data to display  Initial Impression / Assessment and Plan / UC Course  I have reviewed the triage vital signs and the nursing notes.  Pertinent labs & imaging results that were available during my care of the patient were reviewed by me and considered in my medical decision making (see chart for details).       Pt is a 32 y.o. female who presents for 6-7 days of respiratory symptoms. [name] is afebrile here without recent antipyretics. Satting well on room air. Overall pt is non-toxic appearing, well hydrated, without respiratory distress. Pulmonary exam is remarkable for expiratory.  RSV testing obtained as she is pregnant with close exposure.  RSV, COVID and influenza testing were negative. History consistent with viral respiratory illness. Discussed symptomatic treatment.  Explained lack of efficacy of antibiotics in viral disease.  Typical duration of symptoms discussed.  Given albuterol inhaler to be used  at bedtime and as needed throughout the day for  wheezing and cough.  Return and ED precautions given and voiced understanding. Discussed MDM, treatment plan and plan for follow-up with patient who agrees with plan.     Final Clinical Impressions(s) / UC Diagnoses   Final diagnoses:  Viral URI with cough     Discharge Instructions      Your COVID, influenza and RSV tests are negative.   Follow up in 1 week if not improved with over-the-counter medications and albuterol inhaler. Stop by the pharmacy to pick up your prescription.       ED Prescriptions     Medication Sig Dispense Auth. Provider   albuterol (VENTOLIN HFA) 108 (90 Base) MCG/ACT inhaler Inhale 2 puffs into the lungs every 4 (four) hours as needed. 6.7 g Lyndee Hensen, DO      PDMP not reviewed this encounter.   Lyndee Hensen, DO 10/02/22 1717

## 2022-10-02 NOTE — Discharge Instructions (Addendum)
Your COVID, influenza and RSV tests are negative.   Follow up in 1 week if not improved with over-the-counter medications and albuterol inhaler. Stop by the pharmacy to pick up your prescription.

## 2022-10-02 NOTE — ED Triage Notes (Addendum)
Pt states almost a week of cough. [redacted] weeks pregnant. Over the weekend she felt like she was wheezing. No fevers.  NAD in triage. Co-worker had RSV 2 weeks ago

## 2022-10-05 ENCOUNTER — Ambulatory Visit: Payer: BC Managed Care – PPO | Admitting: Physical Therapy

## 2022-10-05 DIAGNOSIS — M62838 Other muscle spasm: Secondary | ICD-10-CM | POA: Diagnosis not present

## 2022-10-05 DIAGNOSIS — R293 Abnormal posture: Secondary | ICD-10-CM | POA: Diagnosis not present

## 2022-10-05 DIAGNOSIS — M25551 Pain in right hip: Secondary | ICD-10-CM

## 2022-10-05 DIAGNOSIS — M25552 Pain in left hip: Secondary | ICD-10-CM | POA: Diagnosis not present

## 2022-10-05 NOTE — Therapy (Signed)
OUTPATIENT PHYSICAL THERAPY LOWER EXTREMITY PROGRESS NOTE   Patient Name: Jessica Rhodes MRN: [id number] DOB:10/03/1990, 32 y.o., female Today's Date: 10/05/2022  END OF SESSION:  PT End of Session - 10/05/22 0851     Visit Number 2    Date for PT Re-Evaluation 11/03/22    Authorization Type [employer]    Authorization Time Period 09/22/22 to 11/03/22    PT Start Time 0851    PT Stop Time 0929    PT Time Calculation (min) 38 min    Activity Tolerance No increased pain;Patient tolerated treatment well             Past Medical History:  Diagnosis Date   Allergy    Anxiety    Past Surgical History:  Procedure Laterality Date   BREAST REDUCTION SURGERY Bilateral 05/05/2020   Procedure: MAMMARY REDUCTION  (BREAST);  Surgeon: Cindra Presume, MD;  Location: [facility];  Service: Plastics;  Laterality: Bilateral;   TONSILECTOMY/ADENOIDECTOMY WITH MYRINGOTOMY     Patient Active Problem List   Diagnosis Date Noted   Septate uterus affecting pregnancy 08/31/2022   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  I haven't walked as much.  I'm trying not to clench as much and that hurts.  My hips have also been able pop a lot.      Pt states that her hips have been bothering her fairly consistently about 1.5 mo ago. If she is up on her feet all day then she can hardly get up off the couch at the end of the day. She feels that it is about the  same since she the onset. She works from home 50% of the time and when at home she can change her position throughout the day, but when at work she is more sore. She has pain with sleep, especially on the side against the bed.   PERTINENT HISTORY: [redacted] weeks pregnant PAIN:  Are you having pain? No  PRECAUTIONS: Other: Pregnant  WEIGHT BEARING RESTRICTIONS: No  FALLS:  Has patient fallen in last 6 months? 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  PLOF: Independent  PATIENT GOALS: decrease pain in hips with daily activity  NEXT MD VISIT:   OBJECTIVE:   DIAGNOSTIC FINDINGS:   PATIENT SURVEYS:    COGNITION: Overall cognitive status: Within functional limits for tasks assessed     SENSATION: pt denies N/T Not tested   MUSCLE LENGTH: Hamstrings: Right WNL deg; Left WNL deg Marcello Moores test: Right not tested deg; Left not tested deg  POSTURE: increased lumbar lordosis with anterior pelvic tilt  PALPATION: Tenderness and muscle spasm in bilateral glute med, proximal glute max  LOWER EXTREMITY ROM: WNL all directions except hip extension limited to neutral  Passive ROM Right eval Left eval  Hip flexion    Hip extension    Hip abduction    Hip adduction    Hip internal rotation    Hip external rotation    Knee flexion    Knee extension    Ankle dorsiflexion    Ankle plantarflexion    Ankle inversion    Ankle eversion     (Blank rows = not tested)  LOWER EXTREMITY MMT:  MMT Right eval Left eval  Hip flexion    Hip extension sidelying 3+ 3+  Hip abduction 3+ 3+  Hip adduction    Hip internal rotation    Hip external rotation    Knee flexion    Knee extension    Ankle dorsiflexion    Ankle plantarflexion    Ankle inversion    Ankle eversion     (Blank rows = not tested)  LOWER EXTREMITY  SPECIAL TESTS:    FUNCTIONAL TESTS:  Squats onto toes, quadriceps dominant  GAIT: Pt walking 10-15 minutes intermittently for exercise   TODAY'S TREATMENT:                                                                                                                              DATE:  1/25: Discussion of sleeping positions to avoid pressure on trochanteric bursa  Quadruped rock  Cat cow Tail wag (recommend leaning on elbows secondary to wrist pain) Sitting on green ball: pelvic mobility Leaning on counter: pulsed hip abduction 2x10 Leaning on counter: pulsed hip extension 2x10 Use of lumbar roll for work and frequent movement breaks      09/25/22 Addaday Lt and Rt gluteals x12min Sleep positions   Long sitting hip rotation Rt/Lt in 90/90 position   PATIENT EDUCATION:  Education details: eval findings Person educated: Patient Education method: Explanation and Handouts Education comprehension: verbalized understanding and returned demonstration  HOME EXERCISE PROGRAM: Access Code: [email or web address] URL: [email or web address] Date: 10/05/2022 Prepared by: Ruben Im  Exercises - Supine Hip Internal and External Rotation  - 3 x daily - 7 x weekly - 1 sets - 10 reps - Quadruped Rocking Slow  - 1 x daily - 7 x weekly - 1 sets - 10 reps - Cat Cow  - 1 x daily - 7 x weekly - 3 sets - 10 reps - Pelvic Tilt on Swiss Ball  - 1 x daily - 7 x weekly - 3 sets - 10 reps - Pelvic Circles on Swiss Ball  - 1 x daily - 7 x weekly - 3 sets - 10 reps - Standing Hip Abduction with Counter Support  - 1 x daily - 7 x weekly - 2-3 sets - 10 reps - Standing Hip Extension with Counter Support  - 1 x daily - 7 x weekly - 2-3 sets - 10 reps Access Code: [email or web address] URL: [email or web address] Date: 09/22/2022 Prepared by: [facility]  Exercises - Supine Hip Internal and External Rotation  -  3 x daily - 7 x weekly - 1 sets -  10 reps  ASSESSMENT:  CLINICAL IMPRESSION: Therapist providing patient education to reduce compression and friction of trochanteric bursa.  Good response to pelvic mobility and anti-inflammatory strategies.  Elbows rather than hands modifications secondary to wrist pain with weightbearing.      OBJECTIVE IMPAIRMENTS: decreased activity tolerance, decreased endurance, difficulty walking, decreased strength, increased muscle spasms, improper body mechanics, postural dysfunction, and pain.   ACTIVITY LIMITATIONS: bending, standing, squatting, sleeping, and locomotion level  PARTICIPATION LIMITATIONS: community activity and occupation  PERSONAL FACTORS: Fitness and 1 comorbidity: pregnancy  are also affecting patient's functional outcome.   REHAB POTENTIAL: Good  CLINICAL DECISION MAKING: Stable/uncomplicated  EVALUATION COMPLEXITY: Low   GOALS: Goals reviewed with patient? Yes  SHORT TERM GOALS: Target date: 09/29/22 Pt will be independent with initial HEP to improve strength and posture awareness  Baseline: Goal status: met 1/25  LONG TERM GOALS: Target date: 11/03/22  Pt will have improved glute strength to atleast 4/5 MMT. Baseline:  Goal status: INITIAL  2.  Pt will have improved LE endurance evident by her ability to go for 20 minute walk up to 5 days a week without significant increase in pain.  Baseline: 10-15 min sporadically Goal status: INITIAL  3.  Pt will report atleast 40% improvement in hip pain throughout the day. Baseline:  Goal status: INITIAL  4.  Pt will be able to describe 2 pushing positions with proper hip position, breathing and relaxation to prep for labor and delivery. Baseline:  Goal status: INITIAL     PLAN:  PT FREQUENCY: 1x/week  PT DURATION: 6 weeks  PLANNED INTERVENTIONS: Therapeutic exercises, Therapeutic activity, Neuromuscular re-education, Balance training, Gait training, Patient/Family education, Self Care, Joint mobilization,  Taping, and Re-evaluation  PLAN FOR NEXT SESSION: f/u on walking and progress towards this goal as able, trunk and hip strength progression  Lavinia Sharps, PT 10/05/22 6:40 PM Phone: [phone] Fax: [phone]

## 2022-10-09 ENCOUNTER — Ambulatory Visit
Admission: EM | Admit: 2022-10-09 | Discharge: 2022-10-09 | Disposition: A | Discharge status: Home / Self Care | Payer: BC Managed Care – PPO | Attending: Emergency Medicine | Admitting: Emergency Medicine

## 2022-10-09 DIAGNOSIS — J069 Acute upper respiratory infection, unspecified: Secondary | ICD-10-CM | POA: Diagnosis not present

## 2022-10-09 MED ORDER — IPRATROPIUM BROMIDE 0.03 % NA SOLN: 2.0000 | Freq: Two times a day (BID) | NASAL | 12 refills | Status: DC

## 2022-10-09 MED ORDER — AMOXICILLIN 500 MG PO CAPS: 500.0000 mg | ORAL_CAPSULE | Freq: Two times a day (BID) | ORAL | 0 refills | Status: AC

## 2022-10-09 NOTE — ED Triage Notes (Addendum)
Pt states she was seen x1 week ago for cold sxs, pt states she started wheezing, pt x25 weeks pregnant, pt states inhaler has not helped. Pt reports congestion is much worse & having sinus pain, pressure, ear pain.

## 2022-10-09 NOTE — Discharge Instructions (Signed)
Today you are being treated for upper respiratory infection, as symptoms have been present for 14 days with no signs of improvement we will provide coverage for bacteria  Begin amoxicillin every morning and every evening for 10 days, daily you will see improvement in about 48 hours and steady progression from there  You may begin use of ipratropium nasal spray every morning and every evening to ideally reduce pressure within sinuses which will help with your headaches and ear pain  You may use Tylenol 500 mg every 6 hours as needed for management of discomfort, may also hold warm compresses to the external ear  You may continue use of Robitussin and Delsym for additional support, etc. packet is a list of medications that are safe for you to use over-the-counter, may attempt any of the following in addition

## 2022-10-09 NOTE — ED Provider Notes (Addendum)
[facility]    CSN: [id number] Arrival date & time: 10/09/22  1340      History   Chief Complaint Chief Complaint  Patient presents with   Cough    Cough, congestion, and wheezing for 2 weeks. I am [redacted] weeks pregnant. - Entered by patient   Wheezing    HPI Jessica Rhodes is a 32 y.o. female.   Patient presents for evaluation of nasal congestion, rhinorrhea, sinus pain and pressure, intermittent generalized headaches, sore throat, left-sided ear fullness, cough and wheezing present for 2 weeks.  Began to experience subjective fever, chills and bodyaches overnight.  Was evaluated 1 week ago in urgent care, COVID, flu and RSV testing negative at that time, prescribed albuterol inhaler which has been ineffective.  Since that symptoms have persisted and are beginning to worsen.  Is worsening headache causing abdominal and back soreness.  Denies new sick contact, known sick contact was 3 weeks prior with RSV.  Tolerating food and liquids.  Has additionally attempted use of Robitussin and Delsym.  Currently [redacted] weeks pregnant.  Denies vaginal bleeding, abdominal cramping.    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 08/07/2022   Supervision of high risk pregnancy in second trimester 06/21/2022   ADHD 06/21/2022   S/P bilateral breast reduction 06/04/2020   Anxiety 01/28/2012    Past Surgical History:  Procedure Laterality Date   BREAST REDUCTION SURGERY Bilateral 05/05/2020   Procedure: MAMMARY REDUCTION  (BREAST);  Surgeon: Allena Napoleon, MD;  Location: [facility];  Service: Plastics;  Laterality: Bilateral;   TONSILECTOMY/ADENOIDECTOMY WITH MYRINGOTOMY      OB History     Gravida  1   Para      Term      Preterm      AB      Living          SAB      IAB      Ectopic      Multiple      Live Births               Home Medications    Prior to Admission medications   Medication Sig Start Date End Date Taking? Authorizing Provider  acetaminophen (TYLENOL) 500 MG tablet Take 1,000 mg by mouth every 6 (six) hours as needed.    [provider]  albuterol (VENTOLIN HFA) 108 (90 Base) MCG/ACT inhaler Inhale 2 puffs into the lungs every 4 (four) hours as needed. 10/02/22   Brimage, Seward Meth, DO  escitalopram (LEXAPRO) 20 MG tablet Take 20 mg by mouth daily. 05/08/22   [provider]  LORazepam (ATIVAN) 2 MG tablet Take by mouth as needed.    [provider]  Magnesium Oxide -Mg Supplement (MAG-OXIDE) 200 MG TABS Take 2 tablets (400 mg total) by mouth at bedtime. If that amount causes loose stools in the am, switch to 200mg  daily at bedtime. 09/13/22   11/12/22, CNM  Prenatal Vit-Fe Fumarate-FA (PRENATAL MULTIVITAMIN) TABS tablet Take 1 tablet by mouth daily at 12 noon.    [provider]    Family History Family History  Problem Relation Age of Onset   Cancer Paternal Grandmother    Heart disease Paternal Grandfather    Diabetes  Paternal Grandfather     Social History Social History   Tobacco Use   Smoking status: Former    Types: Cigarettes    Quit date: 09/26/2019    Years since quitting: 3.0   Smokeless tobacco: Never  Vaping Use   Vaping Use: Former   Substances: Nicotine  Substance Use Topics   Alcohol use: Not Currently    Comment: socially   Drug use: No     Allergies   Patient has no known allergies.   Review of Systems Review of Systems  Constitutional:  Positive for chills and fever. Negative for activity change, appetite change, diaphoresis, fatigue and unexpected weight change.  HENT:  Positive for congestion, ear pain, rhinorrhea, sinus pressure, sinus pain and sore throat. Negative for dental problem, drooling, ear discharge, facial swelling, hearing  loss, mouth sores, nosebleeds, postnasal drip, sneezing, tinnitus, trouble swallowing and voice change.   Respiratory:  Positive for cough and wheezing. Negative for apnea, choking, chest tightness, shortness of breath and stridor.   Cardiovascular: Negative.   Gastrointestinal: Negative.   Skin: Negative.   Neurological: Negative.      Physical Exam Triage Vital Signs ED Triage Vitals  Enc Vitals Group     BP 10/09/22 1450 121/75     Pulse Rate 10/09/22 1450 97     Resp --      Temp 10/09/22 1450 97.7 F (36.5 C)     Temp src --      SpO2 10/09/22 1450 98 %     Weight 10/09/22 1447 210 lb (95.3 kg)     Height 10/09/22 1447 5\' 3"  (1.6 m)     Head Circumference --      Peak Flow --      Pain Score 10/09/22 1449 5     Pain Loc --      Pain Edu? --      Excl. in [place]? --    No data found.  Updated Vital Signs BP 121/75 (BP Location: Left Arm)   Pulse 97   Temp 97.7 F (36.5 C)   Ht 5\' 3"  (1.6 m)   Wt 210 lb (95.3 kg)   LMP 04/16/2022 (Exact Date)   SpO2 98%   BMI 37.20 kg/m   Visual Acuity Right Eye Distance:   Left Eye Distance:   Bilateral Distance:    Right Eye Near:   Left Eye Near:    Bilateral Near:     Physical Exam Constitutional:      Appearance: Normal appearance.  HENT:     Right Ear: Tympanic membrane, ear canal and external ear normal.     Left Ear: Tympanic membrane, ear canal and external ear normal.     Nose: Congestion and rhinorrhea present.     Mouth/Throat:     Mouth: Mucous membranes are moist.     Pharynx: No posterior oropharyngeal erythema.  Eyes:     Extraocular Movements: Extraocular movements intact.  Cardiovascular:     Rate and Rhythm: Normal rate and regular rhythm.     Pulses: Normal pulses.     Heart sounds: Normal heart sounds.  Pulmonary:     Effort: Pulmonary effort is normal.     Breath sounds: Normal breath sounds.  Skin:    General: Skin is warm and dry.  Neurological:     Mental Status: She is alert and  oriented to person, place, and time. Mental status is at baseline.    UC Treatments / Results  Labs (  all labs ordered are listed, but only abnormal results are displayed) Labs Reviewed - No data to display  EKG   Radiology No results found.  Procedures Procedures (including critical care time)  Medications Ordered in UC Medications - No data to display  Initial Impression / Assessment and Plan / UC Course  I have reviewed the triage vital signs and the nursing notes.  Pertinent labs & imaging results that were available during my care of the patient were reviewed by me and considered in my medical decision making (see chart for details).  Acute upper respiratory infection  Vital signs are stable patient is in no signs of distress nontoxic-appearing, lungs are clear to auscultation, low suspicion for pneumonia or bronchitis therefore imaging deferred, no respiratory history, prescribed amoxicillin and ipratropium nasal spray, due to pregnancy will defer steroids, recommended continued use of supportive care as well as inhaler and follow-up if symptoms continue to persist past use of antibiotic, given list of medications to use during pregnancy Final Clinical Impressions(s) / UC Diagnoses   Final diagnoses:  None   Discharge Instructions   None    ED Prescriptions   None    PDMP not reviewed this encounter.   Hans Eden, NP 10/09/22 1544    Hans Eden, NP 10/09/22 1544

## 2022-10-11 ENCOUNTER — Ambulatory Visit: Payer: BC Managed Care – PPO | Admitting: Certified Nurse Midwife

## 2022-10-11 DIAGNOSIS — Q5128 Other doubling of uterus, other specified: Secondary | ICD-10-CM

## 2022-10-11 DIAGNOSIS — Z3A25 25 weeks gestation of pregnancy: Secondary | ICD-10-CM

## 2022-10-11 DIAGNOSIS — O0992 Supervision of high risk pregnancy, unspecified, second trimester: Secondary | ICD-10-CM

## 2022-10-12 NOTE — Progress Notes (Signed)
Pt did not come to appt

## 2022-10-13 ENCOUNTER — Telehealth: Payer: Self-pay | Admitting: Physical Therapy

## 2022-10-13 ENCOUNTER — Ambulatory Visit: Payer: BC Managed Care – PPO | Attending: Maternal & Fetal Medicine

## 2022-10-13 ENCOUNTER — Ambulatory Visit: Payer: BC Managed Care – PPO | Attending: Certified Nurse Midwife | Admitting: Physical Therapy

## 2022-10-13 ENCOUNTER — Ambulatory Visit: Payer: BC Managed Care – PPO | Admitting: *Deleted

## 2022-10-13 VITALS — BP 129/70 | HR 86

## 2022-10-13 DIAGNOSIS — M25552 Pain in left hip: Secondary | ICD-10-CM | POA: Insufficient documentation

## 2022-10-13 DIAGNOSIS — E669 Obesity, unspecified: Secondary | ICD-10-CM | POA: Diagnosis not present

## 2022-10-13 DIAGNOSIS — O99212 Obesity complicating pregnancy, second trimester: Secondary | ICD-10-CM | POA: Diagnosis not present

## 2022-10-13 DIAGNOSIS — O3402 Maternal care for unspecified congenital malformation of uterus, second trimester: Secondary | ICD-10-CM | POA: Diagnosis not present

## 2022-10-13 DIAGNOSIS — O0992 Supervision of high risk pregnancy, unspecified, second trimester: Secondary | ICD-10-CM | POA: Insufficient documentation

## 2022-10-13 DIAGNOSIS — Q5128 Other doubling of uterus, other specified: Secondary | ICD-10-CM | POA: Diagnosis not present

## 2022-10-13 DIAGNOSIS — M25551 Pain in right hip: Secondary | ICD-10-CM | POA: Insufficient documentation

## 2022-10-13 DIAGNOSIS — Z3A25 25 weeks gestation of pregnancy: Secondary | ICD-10-CM

## 2022-10-13 DIAGNOSIS — M62838 Other muscle spasm: Secondary | ICD-10-CM | POA: Insufficient documentation

## 2022-10-13 NOTE — Telephone Encounter (Signed)
Patient states she called yesterday to cancel this morning's appt.  She has an upper resp infection and sinus infection.

## 2022-10-17 ENCOUNTER — Ambulatory Visit: Payer: BC Managed Care – PPO | Admitting: Physical Therapy

## 2022-10-27 ENCOUNTER — Ambulatory Visit: Payer: BC Managed Care – PPO | Admitting: Physical Therapy

## 2022-10-27 DIAGNOSIS — M25551 Pain in right hip: Secondary | ICD-10-CM | POA: Diagnosis not present

## 2022-10-27 DIAGNOSIS — M25552 Pain in left hip: Secondary | ICD-10-CM

## 2022-10-27 DIAGNOSIS — M62838 Other muscle spasm: Secondary | ICD-10-CM | POA: Diagnosis not present

## 2022-10-27 NOTE — Therapy (Addendum)
OUTPATIENT PHYSICAL THERAPY LOWER EXTREMITY PROGRESS NOTE/DISCHARGE SUMMARY   Patient Name: Jessica Rhodes MRN: [id number] DOB:08/24/1991, 32 y.o., female Today's Date: 10/27/2022  END OF SESSION:  PT End of Session - 10/27/22 1021     Visit Number 3    Date for PT Re-Evaluation 11/03/22    Authorization Type BC[employer]    Authorization Time Period 09/22/22 to 11/03/22    PT Start Time 1020    PT Stop Time 1100    PT Time Calculation (min) 40 min    Activity Tolerance Patient tolerated treatment well             Past Medical History:  Diagnosis Date   Allergy    Anxiety    Past Surgical History:  Procedure Laterality Date   BREAST REDUCTION SURGERY Bilateral 05/05/2020   Procedure: MAMMARY REDUCTION  (BREAST);  Surgeon: Cindra Presume, MD;  Location: [facility];  Service: Plastics;  Laterality: Bilateral;   TONSILECTOMY/ADENOIDECTOMY WITH MYRINGOTOMY     Patient Active Problem List   Diagnosis Date Noted   Septate uterus affecting pregnancy 08/31/2022   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  Worse as day goes on.  Got an exercise ball.      PERTINENT HISTORY: [redacted] weeks pregnant PAIN:  Are you having pain? No  PRECAUTIONS: Other: Pregnant  WEIGHT BEARING RESTRICTIONS: No  FALLS:  Has patient fallen in last 6 months? 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  PLOF: Independent  PATIENT GOALS: decrease pain in hips with daily activity  NEXT MD VISIT:   OBJECTIVE:   DIAGNOSTIC FINDINGS:   PATIENT SURVEYS:    COGNITION: Overall cognitive status: Within functional limits for tasks assessed     SENSATION: pt denies N/T Not tested   MUSCLE LENGTH: Hamstrings: Right WNL deg; Left WNL deg Marcello Moores test: Right not tested deg; Left not tested deg  POSTURE: increased lumbar lordosis with anterior pelvic tilt  PALPATION: Tenderness and muscle spasm in bilateral glute med, proximal glute max  LOWER EXTREMITY ROM: WNL all directions except hip extension limited to neutral  Passive ROM Right eval Left eval  Hip flexion    Hip extension    Hip abduction    Hip adduction    Hip internal rotation    Hip external rotation    Knee flexion    Knee extension    Ankle dorsiflexion    Ankle plantarflexion    Ankle inversion    Ankle eversion     (Blank rows = not tested)  LOWER EXTREMITY MMT:  MMT Right eval Left eval  Hip flexion    Hip extension sidelying 3+ 3+  Hip abduction 3+ 3+  Hip adduction    Hip  internal rotation    Hip external rotation    Knee flexion    Knee extension    Ankle dorsiflexion    Ankle plantarflexion    Ankle inversion    Ankle eversion     (Blank rows = not tested)  LOWER EXTREMITY SPECIAL TESTS:    FUNCTIONAL TESTS:  Squats onto toes, quadriceps dominant  GAIT: Pt walking 10-15 minutes intermittently for exercise   TODAY'S TREATMENT:                                                                                                                              DATE:  2/16: Squatting at the sink 3x Foot on chair open books 1/2 kneeling open books Standing bil shoulder horizontal abduction green band 10x, diagonals 5x Standing bil rows  10x Standing with band under feet dead lifts 10x Standing with band under feet squats 10x Patient given green band for home use Encouraged walking program Discussion on benefits of ex throughout the day for pain control and post partem benefits for taking care of the baby     1/25: Discussion of sleeping positions to avoid pressure on trochanteric bursa  Quadruped rock  Cat cow Tail wag (recommend leaning on elbows secondary to wrist pain) Sitting on green ball: pelvic mobility Leaning on counter: pulsed hip abduction 2x10 Leaning on counter: pulsed hip extension 2x10 Use of lumbar roll for work and frequent movement breaks      09/25/22 Addaday Lt and Rt gluteals x15min Sleep positions   Long sitting hip rotation Rt/Lt in 90/90 position   PATIENT EDUCATION:  Education details: eval findings Person educated: Patient Education method: Explanation and Handouts Education comprehension: verbalized understanding and returned demonstration  HOME EXERCISE PROGRAM:   Access Code: [email or web address] URL: [email or web address] Date: 10/27/2022 Prepared by: Ruben Im  Exercises - Supine Hip Internal and External Rotation  - 3 x daily - 7 x weekly - 1 sets - 10 reps - Quadruped Rocking Slow  - 1 x daily - 7 x weekly - 1 sets - 10 reps - Cat Cow  - 1 x daily - 7 x weekly - 3 sets - 10 reps - Pelvic Tilt on Swiss Ball  - 1 x daily - 7 x weekly - 3 sets - 10 reps - Pelvic Circles on Swiss Ball  - 1 x daily - 7 x weekly - 3 sets - 10 reps - Standing Hip Abduction with Counter Support  - 1 x daily - 7 x weekly - 2-3 sets - 10 reps - Standing Hip Extension with Counter Support  - 1 x daily - 7 x weekly - 2-3 sets - 10 reps - Squat  - 1 x daily - 7 x weekly - 1 sets - 10 reps - Deadlift with Resistance  - 1 x daily - 7 x weekly - 1 sets - 10 reps - Clamshell with Resistance  - 1 x daily - 7 x  weekly - 1 sets - 10 reps - Sidelying Reverse Clamshell with Resistance  - 1 x  daily - 7 x weekly - 1 sets - 10 reps - Standing Row with Anchored Resistance  - 1 x daily - 7 x weekly - 1 sets - 10 reps - Standing Shoulder Horizontal Abduction with Resistance  - 1 x daily - 7 x weekly - 1 sets - 10 reps  ASSESSMENT:  CLINICAL IMPRESSION: Good response to exercise with focus on hip mobility (opening strategies) as well as strengthening lumbo/pelvic/hip core musculature to meet the demands of taking care of an infant.  Education on frequent change of position and stretch or walking breaks as much as possible.  Pain level remains low throughout session.   OBJECTIVE IMPAIRMENTS: decreased activity tolerance, decreased endurance, difficulty walking, decreased strength, increased muscle spasms, improper body mechanics, postural dysfunction, and pain.   ACTIVITY LIMITATIONS: bending, standing, squatting, sleeping, and locomotion level  PARTICIPATION LIMITATIONS: community activity and occupation  PERSONAL FACTORS: Fitness and 1 comorbidity: pregnancy  are also affecting patient's functional outcome.   REHAB POTENTIAL: Good  CLINICAL DECISION MAKING: Stable/uncomplicated  EVALUATION COMPLEXITY: Low   GOALS: Goals reviewed with patient? Yes  SHORT TERM GOALS: Target date: 09/29/22 Pt will be independent with initial HEP to improve strength and posture awareness  Baseline: Goal status: met 1/25  LONG TERM GOALS: Target date: 11/03/22  Pt will have improved glute strength to atleast 4/5 MMT. Baseline:  Goal status: INITIAL  2.  Pt will have improved LE endurance evident by her ability to go for 20 minute walk up to 5 days a week without significant increase in pain.  Baseline: 10-15 min sporadically Goal status: INITIAL  3.  Pt will report atleast 40% improvement in hip pain throughout the day. Baseline:  Goal status: INITIAL  4.  Pt will be able to describe 2 pushing positions with proper hip position, breathing and relaxation to prep for labor and  delivery. Baseline:  Goal status: INITIAL     PLAN:  PT FREQUENCY: 1x/week  PT DURATION: 6 weeks  PLANNED INTERVENTIONS: Therapeutic exercises, Therapeutic activity, Neuromuscular re-education, Balance training, Gait training, Patient/Family education, Self Care, Joint mobilization, Taping, and Re-evaluation  PLAN FOR NEXT SESSION: check progress toward goals, possible discharge;  f/u on walking and progress towards this goal as able, trunk and hip strength progression Ruben Im, PT 10/27/22 11:00 AM Phone: [phone] Fax: [phone]    PHYSICAL THERAPY DISCHARGE SUMMARY  Visits from Start of Care: 3  Current functional level related to goals / functional outcomes: The patient cancelled last scheduled appt and has not called to reschedule.  Will discharge from PT at this time.   Remaining deficits: As above   Education / Equipment: HEP   Patient agrees to discharge. Patient goals were not met. Patient is being discharged due to not returning since the last visit.  Ruben Im, PT 12/12/22 8:17 AM Phone: [phone] Fax: [phone]

## 2022-10-31 ENCOUNTER — Ambulatory Visit: Payer: BC Managed Care – PPO | Admitting: Physical Therapy

## 2022-10-31 ENCOUNTER — Telehealth: Payer: Self-pay

## 2022-10-31 NOTE — Telephone Encounter (Signed)
Pt reports that she is having cramping that is intermittent over the past two to three days.  Pt states that she has not had pain before.  Pt denies VB.  I advised pt that what she is feeling is normal as it can be due to doing to much, need for hydration.   I explained to the pt that she will have more episodes as she gets further along in pregnancy that is called round ligament pain.  I encouraged pt increase intake, relax, and that she can take warm soak baths.  Pt verbalized understanding with no further questions.  Mel Almond, RN  10/31/22

## 2022-11-03 ENCOUNTER — Other Ambulatory Visit: Payer: Self-pay | Admitting: *Deleted

## 2022-11-03 DIAGNOSIS — Z3480 Encounter for supervision of other normal pregnancy, unspecified trimester: Secondary | ICD-10-CM

## 2022-11-06 DIAGNOSIS — F411 Generalized anxiety disorder: Secondary | ICD-10-CM | POA: Diagnosis not present

## 2022-11-06 DIAGNOSIS — F909 Attention-deficit hyperactivity disorder, unspecified type: Secondary | ICD-10-CM | POA: Diagnosis not present

## 2022-11-06 DIAGNOSIS — Z331 Pregnant state, incidental: Secondary | ICD-10-CM | POA: Diagnosis not present

## 2022-11-08 ENCOUNTER — Other Ambulatory Visit: Payer: Self-pay

## 2022-11-08 ENCOUNTER — Ambulatory Visit: Payer: BC Managed Care – PPO | Admitting: Certified Nurse Midwife

## 2022-11-08 ENCOUNTER — Other Ambulatory Visit: Payer: BC Managed Care – PPO

## 2022-11-08 VITALS — BP 125/74 | HR 92 | Wt 220.0 lb

## 2022-11-08 DIAGNOSIS — Z3480 Encounter for supervision of other normal pregnancy, unspecified trimester: Secondary | ICD-10-CM | POA: Diagnosis not present

## 2022-11-08 DIAGNOSIS — O0993 Supervision of high risk pregnancy, unspecified, third trimester: Secondary | ICD-10-CM

## 2022-11-08 DIAGNOSIS — Z3A29 29 weeks gestation of pregnancy: Secondary | ICD-10-CM

## 2022-11-08 DIAGNOSIS — Q5128 Other doubling of uterus, other specified: Secondary | ICD-10-CM

## 2022-11-08 DIAGNOSIS — O3403 Maternal care for unspecified congenital malformation of uterus, third trimester: Secondary | ICD-10-CM

## 2022-11-08 MED ORDER — ASPIRIN 81 MG PO TBEC: 81.0000 mg | DELAYED_RELEASE_TABLET | Freq: Every day | ORAL | 2 refills | Status: DC

## 2022-11-08 NOTE — Progress Notes (Signed)
   PRENATAL VISIT NOTE  Subjective:  Jessica Rhodes is a 32 y.o. G1P0 at 55w3dbeing seen today for ongoing prenatal care.  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  Patient reports no complaints.  Contractions: Not present. Vag. Bleeding: None.  Movement: Present. Denies leaking of fluid.   The following portions of the patient's history were reviewed and updated as appropriate: allergies, current medications, past family history, past medical history, past social history, past surgical history and problem list.   Objective:   Vitals:   11/08/22 0915 11/08/22 0924  BP: 139/83 125/74  Pulse: (!) 105 92  Weight: 220 lb (99.8 kg)     Fetal Status: Fetal Heart Rate (bpm): 136 Fundal Height: 29 cm Movement: Present     General:  Alert, oriented and cooperative. Patient is in no acute distress.  Skin: Skin is warm and dry. No rash noted.   Cardiovascular: Normal heart rate noted  Respiratory: Normal respiratory effort, no problems with respiration noted  Abdomen: Soft, gravid, appropriate for gestational age.  Pain/Pressure: Absent     Pelvic: Cervical exam deferred        Extremities: Normal range of motion.  Edema: None  Mental Status: Normal mood and affect. Normal behavior. Normal judgment and thought content.   Assessment and Plan:  Pregnancy: G1P0 at 235w3d. Supervision of high risk pregnancy in third trimester - Doing well, feeling regular and vigorous fetal movement  - aspirin EC 81 MG tablet; Take 1 tablet (81 mg total) by mouth daily. Swallow whole.  Dispense: 90 tablet; Refill: 2  2. [redacted] weeks gestation of pregnancy - Routine OB care, completing 3rd trimester labs today  3. Septate uterus  affecting pregnancy in third trimester - Discussed attempting vaginal delivery vs scheduling elective primary Cesarean (which is not indicated for a septate uterus if baby is vertex). Reviewed benefits of vaginal delivery vs surgical and gave copious reassurance.  Preterm labor symptoms and general obstetric precautions including but not limited to vaginal bleeding, contractions, leaking of fluid and fetal movement were reviewed in detail with the patient. Please refer to After Visit Summary for other counseling recommendations.   Return in about 2 weeks (around 11/22/2022) for [place]-P[place], [place] Appointments  Date Time Provider [place]2/28/2024 10:15 AM WaHelaine ChessMBlue [facility] Surgery CenterMMercy [facility]3/09/2022  2:30 PM WMC-MFC NURSE [facility]MFC [facility]3/09/2022  2:45 PM WMC-MFC US5 [facility]MFCUS [facility]3/13/2024  2:35 PM WaGabriel CarinaCNM [facility]MD[facility]3/27/2024  2:35 PM WaHelaine ChessMNorthern [facility] PaMM[facility]4/09/2022  2:30 PM WMC-MFC NURSE [facility]MFC WMMercy [facility]4/09/2022  2:45 PM WMC-MFC US5 [facility]MFCUS WMSt [facility]4/06/2023  2:35 PM WaHelaine ChessMOur [facility] CenterMS[facility]4/17/2024  4:15 PM WaGabriel CarinaCNM WMPortland Va Medical CenterMEssent[facility]4/24/2024  3:55 PM WaGabriel CarinaCNM WMC-CWH WMMercy [facility]  JaGabriel CarinaCNM

## 2022-11-09 ENCOUNTER — Ambulatory Visit: Payer: BC Managed Care – PPO

## 2022-11-09 LAB — HIV ANTIBODY (ROUTINE TESTING W REFLEX): HIV Screen 4th Generation wRfx: NONREACTIVE

## 2022-11-09 LAB — CBC
Hematocrit: 36.9 % (ref 34.0–46.6)
Hemoglobin: 12.4 g/dL (ref 11.1–15.9)
MCH: 29.6 pg (ref 26.6–33.0)
MCHC: 33.6 g/dL (ref 31.5–35.7)
MCV: 88 fL (ref 79–97)
Platelets: 282 10*3/uL (ref 150–450)
RBC: 4.19 x10E6/uL (ref 3.77–5.28)
RDW: 12.4 % (ref 11.7–15.4)
WBC: 10.9 10*3/uL — ABNORMAL HIGH (ref 3.4–10.8)

## 2022-11-09 LAB — GLUCOSE TOLERANCE, 2 HOURS W/ 1HR
Glucose, 1 hour: 243 mg/dL — ABNORMAL HIGH (ref 70–179)
Glucose, 2 hour: 219 mg/dL — ABNORMAL HIGH (ref 70–152)
Glucose, Fasting: 99 mg/dL — ABNORMAL HIGH (ref 70–91)

## 2022-11-09 LAB — RPR: RPR Ser Ql: NONREACTIVE

## 2022-11-10 ENCOUNTER — Encounter: Payer: Self-pay | Admitting: Certified Nurse Midwife

## 2022-11-10 ENCOUNTER — Ambulatory Visit: Payer: BC Managed Care – PPO | Admitting: *Deleted

## 2022-11-10 ENCOUNTER — Ambulatory Visit: Payer: BC Managed Care – PPO | Attending: Maternal & Fetal Medicine

## 2022-11-10 VITALS — BP 129/74 | HR 90

## 2022-11-10 DIAGNOSIS — O24410 Gestational diabetes mellitus in pregnancy, diet controlled: Secondary | ICD-10-CM | POA: Diagnosis not present

## 2022-11-10 DIAGNOSIS — O99212 Obesity complicating pregnancy, second trimester: Secondary | ICD-10-CM

## 2022-11-10 DIAGNOSIS — O3402 Maternal care for unspecified congenital malformation of uterus, second trimester: Secondary | ICD-10-CM | POA: Diagnosis not present

## 2022-11-10 DIAGNOSIS — O99213 Obesity complicating pregnancy, third trimester: Secondary | ICD-10-CM

## 2022-11-10 DIAGNOSIS — O34593 Maternal care for other abnormalities of gravid uterus, third trimester: Secondary | ICD-10-CM

## 2022-11-10 DIAGNOSIS — Q5128 Other doubling of uterus, other specified: Secondary | ICD-10-CM

## 2022-11-10 DIAGNOSIS — O0992 Supervision of high risk pregnancy, unspecified, second trimester: Secondary | ICD-10-CM | POA: Insufficient documentation

## 2022-11-10 DIAGNOSIS — Z8632 Personal history of gestational diabetes: Secondary | ICD-10-CM | POA: Insufficient documentation

## 2022-11-10 DIAGNOSIS — O24419 Gestational diabetes mellitus in pregnancy, unspecified control: Secondary | ICD-10-CM | POA: Insufficient documentation

## 2022-11-10 DIAGNOSIS — E669 Obesity, unspecified: Secondary | ICD-10-CM

## 2022-11-10 DIAGNOSIS — Z3A29 29 weeks gestation of pregnancy: Secondary | ICD-10-CM

## 2022-11-15 ENCOUNTER — Other Ambulatory Visit: Payer: Self-pay

## 2022-11-15 ENCOUNTER — Inpatient Hospital Stay (HOSPITAL_COMMUNITY)
Admission: AD | Admit: 2022-11-15 | Discharge: 2022-11-15 | Disposition: A | Discharge status: Home / Self Care | Payer: BC Managed Care – PPO | Attending: Obstetrics & Gynecology | Admitting: Obstetrics & Gynecology

## 2022-11-15 ENCOUNTER — Encounter (HOSPITAL_COMMUNITY): Payer: Self-pay | Admitting: Obstetrics & Gynecology

## 2022-11-15 DIAGNOSIS — Z3A30 30 weeks gestation of pregnancy: Secondary | ICD-10-CM | POA: Insufficient documentation

## 2022-11-15 DIAGNOSIS — O10913 Unspecified pre-existing hypertension complicating pregnancy, third trimester: Secondary | ICD-10-CM | POA: Diagnosis not present

## 2022-11-15 DIAGNOSIS — O26893 Other specified pregnancy related conditions, third trimester: Secondary | ICD-10-CM | POA: Insufficient documentation

## 2022-11-15 DIAGNOSIS — R030 Elevated blood-pressure reading, without diagnosis of hypertension: Secondary | ICD-10-CM

## 2022-11-15 DIAGNOSIS — R519 Headache, unspecified: Secondary | ICD-10-CM | POA: Insufficient documentation

## 2022-11-15 HISTORY — DX: Gestational diabetes mellitus in pregnancy, unspecified control: O24.419

## 2022-11-15 LAB — COMPREHENSIVE METABOLIC PANEL
ALT: 29 U/L (ref 0–44)
AST: 25 U/L (ref 15–41)
Albumin: 2.7 g/dL — ABNORMAL LOW (ref 3.5–5.0)
Alkaline Phosphatase: 110 U/L (ref 38–126)
Anion gap: 8 (ref 5–15)
BUN: 5 mg/dL — ABNORMAL LOW (ref 6–20)
CO2: 21 mmol/L — ABNORMAL LOW (ref 22–32)
Calcium: 8.5 mg/dL — ABNORMAL LOW (ref 8.9–10.3)
Chloride: 107 mmol/L (ref 98–111)
Creatinine, Ser: 0.7 mg/dL (ref 0.44–1.00)
GFR, Estimated: >60 mL/min (ref >60)
Glucose, Bld: 97 mg/dL (ref 70–99)
Potassium: 3.4 mmol/L — ABNORMAL LOW (ref 3.5–5.1)
Sodium: 136 mmol/L (ref 135–145)
Total Bilirubin: 0.4 mg/dL (ref 0.3–1.2)
Total Protein: 6.1 g/dL — ABNORMAL LOW (ref 6.5–8.1)

## 2022-11-15 LAB — URINALYSIS, ROUTINE W REFLEX MICROSCOPIC
Bilirubin Urine: NEGATIVE
Glucose, UA: NEGATIVE mg/dL
Hgb urine dipstick: NEGATIVE
Ketones, ur: 20 mg/dL — AB
Nitrite: NEGATIVE
Protein, ur: NEGATIVE mg/dL
Specific Gravity, Urine: 1.008 (ref 1.005–1.030)
pH: 7 (ref 5.0–8.0)

## 2022-11-15 LAB — CBC
HCT: 34.5 % — ABNORMAL LOW (ref 36.0–46.0)
Hemoglobin: 12.3 g/dL (ref 12.0–15.0)
MCH: 30.2 pg (ref 26.0–34.0)
MCHC: 35.7 g/dL (ref 30.0–36.0)
MCV: 84.8 fL (ref 80.0–100.0)
Platelets: 283 10*3/uL (ref 150–400)
RBC: 4.07 MIL/uL (ref 3.87–5.11)
RDW: 12.1 % (ref 11.5–15.5)
WBC: 12.2 10*3/uL — ABNORMAL HIGH (ref 4.0–10.5)
nRBC: 0 % (ref 0.0–0.2)

## 2022-11-15 LAB — PROTEIN / CREATININE RATIO, URINE
Creatinine, Urine: 61 mg/dL
Protein Creatinine Ratio: 0.15 mg/mg{Cre} (ref 0.00–0.15)
Total Protein, Urine: 9 mg/dL

## 2022-11-15 MED ORDER — ACETAMINOPHEN 500 MG PO TABS
1000.0000 mg | ORAL_TABLET | Freq: Once | ORAL | Status: AC
  Administered 2022-11-15: 1000 mg via ORAL
  Filled 2022-11-15: qty 2

## 2022-11-15 NOTE — MAU Note (Signed)
Jessica Rhodes is a 32 y.o. at 38w3dhere in MAU reporting: increased BP readings at home. 145/95 after sitting for 15 mins it was 130/93. States she has a headache rating it 4/10 has not taken any meds. States she has swelling in her hands and feet that is worse at night. Denies any LOF, VB and endorses +FM. States at her OB appointment Wednesday her BP was slightly elevated upon arrival.  Onset of complaint: 1100 Pain score: 4/5 Vitals:   11/15/22 1410  BP: 122/77  Pulse: 88  Resp: 18  Temp 98.1 oral     FHT:145 Lab orders placed from triage:  UA

## 2022-11-15 NOTE — MAU Provider Note (Addendum)
History     CSN: [id number]  Arrival date and time: 11/15/22 1317   Event Date/Time   First Provider Initiated Contact with Patient 11/15/22 1430      Chief Complaint  Patient presents with   Hypertension        Headache    Hypertension Associated symptoms include headaches. Pertinent negatives include no abdominal pain or fever.  Headache The primary symptoms include headaches. Primary symptoms do not include dizziness or fever.  Medical issues also include hypertension.   Tarni Alias is a 32yo G1P0 at 17w3dpresenting today for an elevated blood pressure reading and persistent headache. Pt stated that she was at her office this AM and noticed increased swelling in her hands. Pt did not notice any swelling in her ankles or feet. She went home on her lunch break and checked her blood pressure. Her initial reading was 145/95, but went down to 130/93 after sitting on her couch for 15 min. Pt stated that she also had an elevated BP at her last OB appt on Wednesday and her second BP reading was WNL. Pt reports a constant headache since this AM and rates the pain 4/10. Pt denies taking any pain rx for her headache. Pt stated that she only had one other headache in her pregnancy which was months ago. Pt denied LOF and VB and endorses FM.   OB History     Gravida  1   Para      Term      Preterm      AB      Living         SAB      IAB      Ectopic      Multiple      Live Births              Past Medical History:  Diagnosis Date   Allergy    Anxiety    Gestational diabetes     Past Surgical History:  Procedure Laterality Date   BREAST REDUCTION SURGERY Bilateral 05/05/2020   Procedure: MAMMARY REDUCTION  (BREAST);  Surgeon: PCindra Presume MD;  [place] [facility]  Service: Plastics;  Laterality: Bilateral;   TONSILECTOMY/ADENOIDECTOMY WITH MYRINGOTOMY     WISDOM TOOTH EXTRACTION  2016    Family History  Problem Relation Age of  Onset   Cancer Paternal Grandmother    Heart disease Paternal Grandfather    Diabetes Paternal Grandfather     Social History   Tobacco Use   Smoking status: Former    Types: Cigarettes    Quit date: 09/26/2019    Years since quitting: 3.1   Smokeless tobacco: Never  Vaping Use   Vaping Use: Former   Substances: Nicotine  Substance Use Topics   Alcohol use: Not Currently    Comment: socially   Drug use: No    Allergies: No Known Allergies  No medications prior to admission.    Review of Systems  Constitutional:  Negative for activity change and fever.  Eyes:  Negative for visual disturbance.  Cardiovascular:  Negative for leg swelling.  Gastrointestinal:  Negative for abdominal pain.  Genitourinary:  Negative for vaginal bleeding.  Neurological:  Positive for headaches. Negative for dizziness.    Physical Exam   Blood pressure 124/76, pulse 74, temperature 98.1 F (36.7 C), temperature source Oral, resp. rate 18, height '5\' 3"'$  (1.6 m), weight 100.7 kg, last menstrual period 04/16/2022.  Physical Exam Constitutional:  Appearance: She is well-developed.  HENT:     Head: Normocephalic and atraumatic.  Eyes:     Extraocular Movements: Extraocular movements intact.  Cardiovascular:     Rate and Rhythm: Normal rate.  Pulmonary:     Effort: Pulmonary effort is normal.  Abdominal:     Palpations: Abdomen is soft.  Neurological:     Mental Status: She is alert.     Deep Tendon Reflexes: Reflexes normal.  Psychiatric:        Mood and Affect: Mood normal.        Speech: Speech normal.        Behavior: Behavior normal.      MAU Course  Procedures Results for orders placed or performed during the hospital encounter of 11/15/22 (from the past 24 hour(s))  Urinalysis, Routine w reflex microscopic -Urine, Clean Catch     Status: Abnormal   Collection Time: 11/15/22  2:30 PM  Result Value Ref Range   Color, Urine YELLOW YELLOW   APPearance HAZY (A) CLEAR    Specific Gravity, Urine 1.008 1.005 - 1.030   pH 7.0 5.0 - 8.0   Glucose, UA NEGATIVE NEGATIVE mg/dL   Hgb urine dipstick NEGATIVE NEGATIVE   Bilirubin Urine NEGATIVE NEGATIVE   Ketones, ur 20 (A) NEGATIVE mg/dL   Protein, ur NEGATIVE NEGATIVE mg/dL   Nitrite NEGATIVE NEGATIVE   Leukocytes,Ua TRACE (A) NEGATIVE   RBC / HPF 0-5 0 - 5 RBC/hpf   WBC, UA 0-5 0 - 5 WBC/hpf   Bacteria, UA RARE (A) NONE SEEN   Squamous Epithelial / HPF 11-20 0 - 5 /HPF   Mucus PRESENT   Protein / creatinine ratio, urine     Status: None   Collection Time: 11/15/22  2:30 PM  Result Value Ref Range   Creatinine, Urine 61 mg/dL   Total Protein, Urine 9 mg/dL   Protein Creatinine Ratio 0.15 0.00 - 0.15 mg/mg[Cre]  CBC     Status: Abnormal   Collection Time: 11/15/22  2:58 PM  Result Value Ref Range   WBC 12.2 (H) 4.0 - 10.5 K/uL   RBC 4.07 3.87 - 5.11 MIL/uL   Hemoglobin 12.3 12.0 - 15.0 g/dL   HCT 34.5 (L) 36.0 - 46.0 %   MCV 84.8 80.0 - 100.0 fL   MCH 30.2 26.0 - 34.0 pg   MCHC 35.7 30.0 - 36.0 g/dL   RDW 12.1 11.5 - 15.5 %   Platelets 283 150 - 400 K/uL   nRBC 0.0 0.0 - 0.2 %  Comprehensive metabolic panel     Status: Abnormal   Collection Time: 11/15/22  2:58 PM  Result Value Ref Range   Sodium 136 135 - 145 mmol/L   Potassium 3.4 (L) 3.5 - 5.1 mmol/L   Chloride 107 98 - 111 mmol/L   CO2 21 (L) 22 - 32 mmol/L   Glucose, Bld 97 70 - 99 mg/dL   BUN 5 (L) 6 - 20 mg/dL   Creatinine, Ser 0.70 0.44 - 1.00 mg/dL   Calcium 8.5 (L) 8.9 - 10.3 mg/dL   Total Protein 6.1 (L) 6.5 - 8.1 g/dL   Albumin 2.7 (L) 3.5 - 5.0 g/dL   AST 25 15 - 41 U/L   ALT 29 0 - 44 U/L   Alkaline Phosphatase 110 38 - 126 U/L   Total Bilirubin 0.4 0.3 - 1.2 mg/dL   GFR, Estimated >60 >60 mL/min   Anion gap 8 5 - 15  NST Mode: External  Baseline Rate: 135 Accelerations: 15x15   MDM Lab orders: -Protein/ creatinine ratio, urine  -Comprehensive metabolic panel  -CBC -Urinalysis, Routine w reflex microscope-  Urine, Clean Catch  Meds ordered: -Acetominophen (Tylenol) tablet '1000mg'$   Assessment and Plan  Discussed return instructions to MAU such as blurry vision, RUQ/ epigastric pain, increasing severity in headache and altered mental status.  Discussed that pt is OK to make outpatient OB appt for future elevated BP readings w/o associated preeclampsia sx.  Pt discharged home with Tylenol for pain control.    Carentxa Goede PA-S 11/15/2022, 5:09 PM    Attestation of Supervision of Student:  I confirm that I have verified the information documented in the physician assistant student's note and that I have also personally performed the history, physical exam and all medical decision making activities.  I have verified that all services and findings are accurately documented in this student's note; and I agree with management and plan as outlined in the documentation. I have also made any necessary editorial changes.  History GUERLINE PENDERGRAST is a 32 y.o. G1P0 at 38w3dwho presents for hypertension & headache. Left work early today when she noticed her hands were swollen & she felt "odd". Headache all day that she rates 4/10. Hasn't treated headache. Took BP when she got home & it was elevated x 2. Denies history of hypertension. Denies blurred vision or epigastric pain.   Physical exam BP 124/76   Pulse 74   Temp 98.1 F (36.7 C) (Oral)   Resp 18   Ht '5\' 3"'$  (1.6 m)   Wt 100.7 kg   LMP 04/16/2022 (Exact Date)   BMI 39.33 kg/m   Physical Examination: General appearance - alert, well appearing, and in no distress Mental status - normal mood, behavior, speech, dress, motor activity, and thought processes Eyes - sclera anicteric Neurological - DTR's normal and symmetric, no clonus Extremities - no pedal edema noted  NST:  Baseline: 135 bpm, Variability: Good {> 6 bpm), Accelerations: Reactive, and Decelerations: Absent  MDM Normotensive in MAU with normal preeclampsia labs Reactive  NST Headache improved with tylenol  Assessment/Plan 1. Headache in pregnancy, antepartum, third trimester   2. Elevated BP without diagnosis of hypertension   3. [redacted] weeks gestation of pregnancy    -Reviewed reasons to return to MAU   LJorje Guild NP 11/15/2022 6:04 PM

## 2022-11-21 ENCOUNTER — Ambulatory Visit (INDEPENDENT_AMBULATORY_CARE_PROVIDER_SITE_OTHER): Payer: BC Managed Care – PPO | Admitting: Registered"

## 2022-11-21 ENCOUNTER — Encounter: Payer: Self-pay | Admitting: Certified Nurse Midwife

## 2022-11-21 ENCOUNTER — Encounter: Payer: BC Managed Care – PPO | Attending: Family Medicine | Admitting: Registered"

## 2022-11-21 ENCOUNTER — Other Ambulatory Visit: Payer: Self-pay

## 2022-11-21 ENCOUNTER — Other Ambulatory Visit: Payer: Self-pay | Admitting: *Deleted

## 2022-11-21 DIAGNOSIS — Z3A31 31 weeks gestation of pregnancy: Secondary | ICD-10-CM | POA: Insufficient documentation

## 2022-11-21 DIAGNOSIS — O24419 Gestational diabetes mellitus in pregnancy, unspecified control: Secondary | ICD-10-CM | POA: Diagnosis not present

## 2022-11-21 DIAGNOSIS — O0993 Supervision of high risk pregnancy, unspecified, third trimester: Secondary | ICD-10-CM

## 2022-11-21 DIAGNOSIS — Z713 Dietary counseling and surveillance: Secondary | ICD-10-CM | POA: Insufficient documentation

## 2022-11-21 NOTE — Progress Notes (Signed)
Patient was seen for Gestational Diabetes self-management on 11/21/22  Start time 0820 and End time 0915   Estimated due date: 01/21/23; [redacted]w[redacted]d Clinical: Medications: reviewed Medical History: reviewed Labs: OGTT 99(H)-243(H)-219(H), A1c 4.9% 07/10/22  Dietary and Lifestyle History: Pt states she is a picky eater, has cut back refined sugar. Pt states she will eat some vegetables including green beans, okra, and some spinach. Pt states she has tried blending vegetables in pasta sauce and will to try that again to increase vegetable intake as well as including leafy greens in fruit smoothies.  Pt states she is active but does not do structured activity.  Physical Activity: ADL Stress: not assessed Sleep: 8-9 hours "good sleep"  24 hr Recall: not assessed  NUTRITION INTERVENTION  Nutrition education (E-1) on the following topics:   Initial Follow-up  '[x]'$  '[]'$  Definition of Gestational Diabetes '[x]'$  '[]'$  Why dietary management is important in controlling blood glucose '[x]'$  '[]'$  Effects each nutrient has on blood glucose levels '[x]'$  '[]'$  Simple carbohydrates vs complex carbohydrates '[x]'$  '[]'$  Fluid intake '[x]'$  '[]'$  Creating a balanced meal plan '[x]'$  '[]'$  Carbohydrate counting  '[x]'$  '[]'$  When to check blood glucose levels '[x]'$  '[]'$  Proper blood glucose monitoring techniques '[x]'$  '[]'$  Effect of stress and stress reduction techniques  '[x]'$  '[]'$  Exercise effect on blood glucose levels, appropriate exercise during pregnancy '[x]'$  '[]'$  Importance of limiting caffeine and abstaining from alcohol and smoking '[x]'$  '[]'$  Medications used for blood sugar control during pregnancy '[x]'$  '[]'$  Hypoglycemia and rule of 15 '[x]'$  '[]'$  Postpartum self care  Blood glucose monitor given: [employer] #[id number]: 2023-07-24 CBG: 114 mg/dL   Patient instructed to monitor glucose levels: FBS: 60 - ? 95 mg/dL; 2 hour: ? 120 mg/dL  Patient received handouts: Nutrition Diabetes and Pregnancy Carbohydrate Counting List  Patient  will be seen for follow-up as needed.

## 2022-11-22 ENCOUNTER — Encounter: Payer: BC Managed Care – PPO | Admitting: Certified Nurse Midwife

## 2022-11-23 ENCOUNTER — Other Ambulatory Visit: Payer: Self-pay

## 2022-11-23 ENCOUNTER — Encounter: Payer: BC Managed Care – PPO | Admitting: Family Medicine

## 2022-11-23 MED ORDER — ONETOUCH VERIO VI STRP: ORAL_STRIP | 12 refills | Status: DC

## 2022-11-23 MED ORDER — ONETOUCH ULTRASOFT LANCETS MISC: 12 refills | Status: DC

## 2022-11-23 MED ORDER — ONETOUCH VERIO W/DEVICE KIT: 1.0000 | PACK | 0 refills | Status: DC | PRN

## 2022-11-27 ENCOUNTER — Other Ambulatory Visit: Payer: Self-pay | Admitting: *Deleted

## 2022-11-27 NOTE — Progress Notes (Signed)
Opened in error ?Eustace Hur,RN ?

## 2022-11-30 ENCOUNTER — Other Ambulatory Visit: Payer: Self-pay

## 2022-11-30 ENCOUNTER — Ambulatory Visit (INDEPENDENT_AMBULATORY_CARE_PROVIDER_SITE_OTHER): Payer: BC Managed Care – PPO | Admitting: Obstetrics and Gynecology

## 2022-11-30 VITALS — BP 126/88 | Wt 221.8 lb

## 2022-11-30 DIAGNOSIS — O34593 Maternal care for other abnormalities of gravid uterus, third trimester: Secondary | ICD-10-CM

## 2022-11-30 DIAGNOSIS — Z3A32 32 weeks gestation of pregnancy: Secondary | ICD-10-CM

## 2022-11-30 DIAGNOSIS — Q5128 Other doubling of uterus, other specified: Secondary | ICD-10-CM

## 2022-11-30 DIAGNOSIS — O3403 Maternal care for unspecified congenital malformation of uterus, third trimester: Secondary | ICD-10-CM

## 2022-11-30 DIAGNOSIS — O0993 Supervision of high risk pregnancy, unspecified, third trimester: Secondary | ICD-10-CM

## 2022-11-30 DIAGNOSIS — O24410 Gestational diabetes mellitus in pregnancy, diet controlled: Secondary | ICD-10-CM

## 2022-11-30 DIAGNOSIS — O0992 Supervision of high risk pregnancy, unspecified, second trimester: Secondary | ICD-10-CM

## 2022-11-30 NOTE — Progress Notes (Signed)
   PRENATAL VISIT NOTE  Subjective:  Jessica Rhodes is a 32 y.o. G1P0 at [redacted]w[redacted]d being seen today for ongoing prenatal care.  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  Patient doing well with no acute concerns today. She reports no complaints.  Contractions: Not present. Vag. Bleeding: None.  Movement: Present. Denies leaking of fluid.   The following portions of the patient's history were reviewed and updated as appropriate: allergies, current medications, past family history, past medical history, past social history, past surgical history and problem list. Problem list updated.  Objective:   Vitals:   11/30/22 0832  BP: 126/88  Weight: 221 lb 12.8 oz (100.6 kg)    Fetal Status: Fetal Heart Rate (bpm): 134 Fundal Height: 33 cm Movement: Present     General:  Alert, oriented and cooperative. Patient is in no acute distress.  Skin: Skin is warm and dry. No rash noted.   Cardiovascular: Normal heart rate noted  Respiratory: Normal respiratory effort, no problems with respiration noted  Abdomen: Soft, gravid, appropriate for gestational age.  Pain/Pressure: Present     Pelvic: Cervical exam deferred        Extremities: Normal range of motion.  Edema: Trace  Mental Status:  Normal mood and affect. Normal behavior. Normal judgment and thought content.   Assessment and Plan:  Pregnancy: G1P0 at [redacted]w[redacted]d  1. Supervision of high risk pregnancy in second trimester Continue routine prenatal care  2. [redacted] weeks gestation of pregnancy   3. Diet controlled gestational diabetes mellitus (GDM) in third trimester FBS: 84-91 PPBS: 91-140, most in range  Good blood sugar control, growth scan on  12/11/22  4. Septate uterus affecting pregnancy in third trimester MFM is monitoring fetal growth due to septum  5. 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  Please refer to After Visit Summary for other counseling recommendations.   Return in about 2 weeks (around 12/14/2022) for [facility], in person.   Lynnda Shields, MD Faculty Attending Center for [facility]

## 2022-12-06 ENCOUNTER — Encounter: Payer: BC Managed Care – PPO | Admitting: Certified Nurse Midwife

## 2022-12-06 ENCOUNTER — Encounter: Payer: Self-pay | Admitting: Obstetrics & Gynecology

## 2022-12-11 ENCOUNTER — Other Ambulatory Visit: Payer: Self-pay | Admitting: *Deleted

## 2022-12-11 ENCOUNTER — Encounter: Payer: Self-pay | Admitting: *Deleted

## 2022-12-11 ENCOUNTER — Ambulatory Visit: Payer: BC Managed Care – PPO | Admitting: *Deleted

## 2022-12-11 ENCOUNTER — Ambulatory Visit: Payer: BC Managed Care – PPO | Attending: Maternal & Fetal Medicine

## 2022-12-11 VITALS — BP 125/76 | HR 79

## 2022-12-11 DIAGNOSIS — O99212 Obesity complicating pregnancy, second trimester: Secondary | ICD-10-CM | POA: Insufficient documentation

## 2022-12-11 DIAGNOSIS — O24410 Gestational diabetes mellitus in pregnancy, diet controlled: Secondary | ICD-10-CM | POA: Insufficient documentation

## 2022-12-11 DIAGNOSIS — O3403 Maternal care for unspecified congenital malformation of uterus, third trimester: Secondary | ICD-10-CM

## 2022-12-11 DIAGNOSIS — O99213 Obesity complicating pregnancy, third trimester: Secondary | ICD-10-CM

## 2022-12-11 DIAGNOSIS — O3402 Maternal care for unspecified congenital malformation of uterus, second trimester: Secondary | ICD-10-CM | POA: Insufficient documentation

## 2022-12-11 DIAGNOSIS — Q5128 Other doubling of uterus, other specified: Secondary | ICD-10-CM | POA: Diagnosis not present

## 2022-12-11 DIAGNOSIS — O0992 Supervision of high risk pregnancy, unspecified, second trimester: Secondary | ICD-10-CM

## 2022-12-11 DIAGNOSIS — Z3689 Encounter for other specified antenatal screening: Secondary | ICD-10-CM

## 2022-12-11 DIAGNOSIS — Z3A34 34 weeks gestation of pregnancy: Secondary | ICD-10-CM

## 2022-12-11 DIAGNOSIS — E669 Obesity, unspecified: Secondary | ICD-10-CM

## 2022-12-18 ENCOUNTER — Other Ambulatory Visit: Payer: Self-pay | Admitting: *Deleted

## 2022-12-18 DIAGNOSIS — O0992 Supervision of high risk pregnancy, unspecified, second trimester: Secondary | ICD-10-CM

## 2022-12-18 DIAGNOSIS — O24419 Gestational diabetes mellitus in pregnancy, unspecified control: Secondary | ICD-10-CM

## 2022-12-18 MED ORDER — ONETOUCH VERIO VI STRP: ORAL_STRIP | 12 refills | Status: DC

## 2022-12-18 MED ORDER — ONETOUCH ULTRASOFT LANCETS MISC: 12 refills | Status: DC

## 2022-12-20 ENCOUNTER — Ambulatory Visit (INDEPENDENT_AMBULATORY_CARE_PROVIDER_SITE_OTHER): Payer: BC Managed Care – PPO | Admitting: Certified Nurse Midwife

## 2022-12-20 ENCOUNTER — Other Ambulatory Visit: Payer: Self-pay

## 2022-12-20 VITALS — BP 136/86 | HR 92 | Wt 227.8 lb

## 2022-12-20 DIAGNOSIS — Q5128 Other doubling of uterus, other specified: Secondary | ICD-10-CM

## 2022-12-20 DIAGNOSIS — O0993 Supervision of high risk pregnancy, unspecified, third trimester: Secondary | ICD-10-CM

## 2022-12-20 DIAGNOSIS — O3403 Maternal care for unspecified congenital malformation of uterus, third trimester: Secondary | ICD-10-CM

## 2022-12-20 DIAGNOSIS — O24410 Gestational diabetes mellitus in pregnancy, diet controlled: Secondary | ICD-10-CM

## 2022-12-20 DIAGNOSIS — Z3A35 35 weeks gestation of pregnancy: Secondary | ICD-10-CM

## 2022-12-21 NOTE — Progress Notes (Signed)
   PRENATAL VISIT NOTE  Subjective:  Jessica Rhodes is a 32 y.o. G1P0 at [redacted]w[redacted]d being seen today for ongoing prenatal care.  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  Patient reports no complaints.  Contractions: Not present. Vag. Bleeding: None.  Movement: Present. Denies leaking of fluid.   The following portions of the patient's history were reviewed and updated as appropriate: allergies, current medications, past family history, past medical history, past social history, past surgical history and problem list.   Objective:   Vitals:   12/20/22 1458  BP: 136/86  Pulse: 92  Weight: 227 lb 12.8 oz (103.3 kg)    Fetal Status: Fetal Heart Rate (bpm): 140   Movement: Present     General:  Alert, oriented and cooperative. Patient is in no acute distress.  Skin: Skin is warm and dry. No rash noted.   Cardiovascular: Normal heart rate noted  Respiratory: Normal respiratory effort, no problems with respiration noted  Abdomen: Soft, gravid, appropriate for gestational age.  Pain/Pressure: Absent     Pelvic: Cervical exam deferred        Extremities: Normal range of motion.  Edema: None  Mental Status: Normal mood and affect. Normal behavior. Normal judgment and thought content.   Assessment and Plan:  Pregnancy: G1P0 at [redacted]w[redacted]d 1. Supervision of high risk pregnancy in third trimester - Doing well, feeling regular and vigorous fetal movement   2. [redacted] weeks gestation of pregnancy - Routine OB care including anticipatory guidance re GBS testing at next visit  3. Septate uterus affecting pregnancy in third trimester - Being followed by MFM, clarified with Dr. Darra Lis that her uterine  didelphys is not an indication for primary CS   4. Diet controlled gestational diabetes mellitus (GDM) in third trimester - All glucose readings in range, IOL at 37-39 weeks recommended  Preterm labor symptoms and general obstetric precautions including but not limited to vaginal bleeding, contractions, leaking of fluid and fetal movement were reviewed in detail with the patient. Please refer to After Visit Summary for other counseling recommendations.   Return in about 1 week (around 12/27/2022) for IN-PERSON, HOB/GBS.  Future Appointments  Date Time Provider Department Center  12/27/2022  4:15 PM Osborne O[facility]  01/03/2023  3:55 PM Osborne Oman [facility]  01/08/2023 10:30 AM [facility] NURSE [facility]  01/08/2023 10:45 AM [facility] US4 [facility]    Bernerd Limbo, CNM

## 2022-12-27 ENCOUNTER — Other Ambulatory Visit (HOSPITAL_COMMUNITY)
Admission: RE | Admit: 2022-12-27 | Discharge: 2022-12-27 | Disposition: A | Discharge status: Home / Self Care | Payer: BC Managed Care – PPO | Source: Ambulatory Visit | Attending: Certified Nurse Midwife | Admitting: Certified Nurse Midwife

## 2022-12-27 ENCOUNTER — Other Ambulatory Visit: Payer: Self-pay

## 2022-12-27 ENCOUNTER — Ambulatory Visit (INDEPENDENT_AMBULATORY_CARE_PROVIDER_SITE_OTHER): Payer: BC Managed Care – PPO | Admitting: Certified Nurse Midwife

## 2022-12-27 VITALS — BP 130/89 | HR 82 | Wt 228.0 lb

## 2022-12-27 DIAGNOSIS — Q5128 Other doubling of uterus, other specified: Secondary | ICD-10-CM

## 2022-12-27 DIAGNOSIS — Z3A36 36 weeks gestation of pregnancy: Secondary | ICD-10-CM

## 2022-12-27 DIAGNOSIS — O0992 Supervision of high risk pregnancy, unspecified, second trimester: Secondary | ICD-10-CM

## 2022-12-27 DIAGNOSIS — O24410 Gestational diabetes mellitus in pregnancy, diet controlled: Secondary | ICD-10-CM

## 2022-12-27 DIAGNOSIS — O3403 Maternal care for unspecified congenital malformation of uterus, third trimester: Secondary | ICD-10-CM

## 2022-12-27 DIAGNOSIS — O0993 Supervision of high risk pregnancy, unspecified, third trimester: Secondary | ICD-10-CM

## 2022-12-28 DIAGNOSIS — O0992 Supervision of high risk pregnancy, unspecified, second trimester: Secondary | ICD-10-CM | POA: Diagnosis not present

## 2022-12-29 LAB — CERVICOVAGINAL ANCILLARY ONLY
Chlamydia: NEGATIVE
Comment: NEGATIVE
Comment: NORMAL
Neisseria Gonorrhea: NEGATIVE

## 2022-12-31 NOTE — Progress Notes (Signed)
   PRENATAL VISIT NOTE  Subjective:  Jessica Rhodes is a 32 y.o. G1P0 at [redacted]w[redacted]d being seen today for ongoing prenatal care.  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  Patient reports no complaints.  Contractions: Not present. Vag. Bleeding: None.  Movement: Present. Denies leaking of fluid.   The following portions of the patient's history were reviewed and updated as appropriate: allergies, current medications, past family history, past medical history, past social history, past surgical history and problem list.   Objective:   Vitals:   12/27/22 1658 12/27/22 1717  BP: (!) 126/92 130/89  Pulse:  82  Weight: 228 lb (103.4 kg)     Fetal Status: Fetal Heart Rate (bpm): 146   Movement: Present     General:  Alert, oriented and cooperative. Patient is in no acute distress.  Skin: Skin is warm and dry. No rash noted.   Cardiovascular: Normal heart rate noted  Respiratory: Normal respiratory effort, no problems with respiration noted  Abdomen: Soft, gravid, appropriate for gestational age.  Pain/Pressure: Present     Pelvic: Cervical exam deferred        Extremities: Normal range of motion.  Edema: Trace  Mental Status: Normal mood and affect. Normal behavior. Normal judgment and thought content.   Assessment and Plan:  Pregnancy: G1P0 at [redacted]w[redacted]d 1. Supervision of high risk pregnancy in second trimester - Doing well, feeling regular and vigorous fetal movement   2. [redacted] weeks gestation of pregnancy - Routine OB care  - Cervicovaginal ancillary only - Culture, beta strep (group b only)  3. Diet controlled gestational diabetes mellitus (GDM) in third trimester - Glucose log shows all  sugars within range and well-controlled with diet - Per MFM, offered IOL at 39 weeks which pt is amenable to - IOL scheduled for 39-1 on 5/6, orders placed  4. Septate uterus affecting pregnancy in third trimester  Preterm labor symptoms and general obstetric precautions including but not limited to vaginal bleeding, contractions, leaking of fluid and fetal movement were reviewed in detail with the patient. Please refer to After Visit Summary for other counseling recommendations.   Return in about 1 week (around 01/03/2023) for IN-PERSON, LOB.  Future Appointments  Date Time Provider Department Center  01/03/2023  3:55 PM [facility]borne [facility]  01/08/2023 10:30 AM [facility] NURSE [facility]  01/08/2023 10:45 AM [facility] US4 [facility]  01/09/2023  9:15 AM Corlis Hove, NP [facility]  01/15/2023  6:45 AM [facility] SCHED ROOM [facility] None   Bernerd Limbo, CNM

## 2023-01-01 ENCOUNTER — Encounter (HOSPITAL_COMMUNITY): Payer: Self-pay

## 2023-01-01 ENCOUNTER — Telehealth (HOSPITAL_COMMUNITY): Payer: Self-pay | Admitting: *Deleted

## 2023-01-01 ENCOUNTER — Encounter: Payer: Self-pay | Admitting: General Practice

## 2023-01-01 LAB — CULTURE, BETA STREP (GROUP B ONLY): Strep Gp B Culture: NEGATIVE

## 2023-01-01 NOTE — Telephone Encounter (Signed)
Preadmission screen  

## 2023-01-02 ENCOUNTER — Telehealth (HOSPITAL_COMMUNITY): Payer: Self-pay | Admitting: *Deleted

## 2023-01-02 NOTE — Telephone Encounter (Signed)
Preadmission screen  

## 2023-01-03 ENCOUNTER — Other Ambulatory Visit: Payer: Self-pay | Admitting: Advanced Practice Midwife

## 2023-01-03 ENCOUNTER — Encounter (HOSPITAL_COMMUNITY): Payer: Self-pay | Admitting: *Deleted

## 2023-01-03 ENCOUNTER — Other Ambulatory Visit: Payer: Self-pay

## 2023-01-03 ENCOUNTER — Ambulatory Visit: Payer: BC Managed Care – PPO | Admitting: Certified Nurse Midwife

## 2023-01-03 VITALS — BP 129/85 | HR 105 | Wt 231.0 lb

## 2023-01-03 DIAGNOSIS — O24415 Gestational diabetes mellitus in pregnancy, controlled by oral hypoglycemic drugs: Secondary | ICD-10-CM

## 2023-01-03 DIAGNOSIS — R519 Headache, unspecified: Secondary | ICD-10-CM | POA: Diagnosis not present

## 2023-01-03 DIAGNOSIS — O0993 Supervision of high risk pregnancy, unspecified, third trimester: Secondary | ICD-10-CM

## 2023-01-03 DIAGNOSIS — O0992 Supervision of high risk pregnancy, unspecified, second trimester: Secondary | ICD-10-CM | POA: Diagnosis not present

## 2023-01-03 DIAGNOSIS — O26893 Other specified pregnancy related conditions, third trimester: Secondary | ICD-10-CM

## 2023-01-03 DIAGNOSIS — Z3A37 37 weeks gestation of pregnancy: Secondary | ICD-10-CM

## 2023-01-03 LAB — POCT URINALYSIS DIP (DEVICE)
Bilirubin Urine: NEGATIVE
Glucose, UA: NEGATIVE mg/dL
Hgb urine dipstick: NEGATIVE
Ketones, ur: NEGATIVE mg/dL
Leukocytes,Ua: NEGATIVE
Nitrite: NEGATIVE
Protein, ur: NEGATIVE mg/dL
Specific Gravity, Urine: 1.02 (ref 1.005–1.030)
Urobilinogen, UA: 0.2 mg/dL (ref 0.0–1.0)
pH: 7.5 (ref 5.0–8.0)

## 2023-01-03 MED ORDER — METFORMIN HCL 500 MG PO TABS
1000.0000 mg | ORAL_TABLET | Freq: Every day | ORAL | 5 refills | Status: DC
Start: 2023-01-03 — End: 2023-01-09

## 2023-01-03 NOTE — Progress Notes (Signed)
   PRENATAL VISIT NOTE  Subjective:  Jessica Rhodes is a 32 y.o. G1P0 at [redacted]w[redacted]d being seen today for ongoing prenatal care.  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  Patient reports occasional contractions.  Contractions: Irritability. Vag. Bleeding: None.  Movement: Present. Denies leaking of fluid.   The following portions of the patient's history were reviewed and updated as appropriate: allergies, current medications, past family history, past medical history, past social history, past surgical history and problem list.   Objective:   Vitals:   01/03/23 1622  BP: 129/85  Pulse: (!) 105  Weight: 231 lb (104.8 kg)    Fetal Status: Fetal Heart Rate (bpm): 140   Movement: Present     General:  Alert, oriented and cooperative. Patient is in no acute distress.  Skin: Skin is warm and dry. No rash noted.   Cardiovascular: Normal heart rate noted  Respiratory: Normal respiratory effort, no problems with respiration noted  Abdomen: Soft, gravid, appropriate for gestational age.  Pain/Pressure: Present     Pelvic: Cervical exam deferred        Extremities: Normal range of motion.  Edema: Trace  Mental Status: Normal mood and affect. Normal behavior. Normal judgment and thought content.   Assessment and Plan:  Pregnancy: G1P0 at [redacted]w[redacted]d 1. Supervision of high risk pregnancy in second trimester - Doing well, feeling regular and vigorous fetal movement   2. [redacted] weeks gestation of pregnancy - Routine OB care   3. Gestational diabetes mellitus (GDM) in third trimester controlled on oral hypoglycemic drug - Glucose log reviewed - fastings normal but post-prandials elevated for about half of  readings (130s-low 160s, mostly 130s, 140s). Called Dr. Donavan Foil to review who agreed pt could start on metformin, but also needs IOL moved up to 38 weeks. IOL rescheduled as suggested. - metFORMIN (GLUCOPHAGE) 500 MG tablet; Take 2 tablets (1,000 mg total) by mouth daily with breakfast.  Dispense: 60 tablet; Refill: 5  4. Headache in pregnancy, antepartum, third trimester - Headache untreated, suggested two extra strength Excedrin-Tension or 1000mg  Tylenol+caffeine - BP normal but will gather labs per pt request (anxious about developing PEC), no s/sx PEC - Comp Met (CMET) - CBC - Protein / creatinine ratio, urine  Term labor symptoms and general obstetric precautions including but not limited to vaginal bleeding, contractions, leaking of fluid and fetal movement were reviewed in detail with the patient. Please refer to After Visit Summary for other counseling recommendations.   Return in about 1 week (around 01/10/2023) for IN-PERSON, HOB.  Future Appointments  Date Time Provider Department Center  01/07/2023 12:00 AM [facility] SCHED ROOM [facility] None   Bernerd Limbo, CNM

## 2023-01-04 ENCOUNTER — Encounter: Payer: Self-pay | Admitting: Certified Nurse Midwife

## 2023-01-04 ENCOUNTER — Encounter: Payer: Self-pay | Admitting: General Practice

## 2023-01-04 LAB — COMPREHENSIVE METABOLIC PANEL
ALT: 10 IU/L (ref 0–32)
AST: 17 IU/L (ref 0–40)
Albumin/Globulin Ratio: 1.5 (ref 1.2–2.2)
Albumin: 3.5 g/dL — ABNORMAL LOW (ref 3.9–4.9)
Alkaline Phosphatase: 164 IU/L — ABNORMAL HIGH (ref 44–121)
BUN/Creatinine Ratio: 11 (ref 9–23)
BUN: 9 mg/dL (ref 6–20)
Bilirubin Total: <0.2 mg/dL (ref 0.0–1.2)
CO2: 18 mmol/L — ABNORMAL LOW (ref 20–29)
Calcium: 9.3 mg/dL (ref 8.7–10.2)
Chloride: 106 mmol/L (ref 96–106)
Creatinine, Ser: 0.8 mg/dL (ref 0.57–1.00)
Globulin, Total: 2.3 g/dL (ref 1.5–4.5)
Glucose: 98 mg/dL (ref 70–99)
Potassium: 4.5 mmol/L (ref 3.5–5.2)
Sodium: 137 mmol/L (ref 134–144)
Total Protein: 5.8 g/dL — ABNORMAL LOW (ref 6.0–8.5)
eGFR: 100 mL/min/{1.73_m2} (ref >59)

## 2023-01-04 LAB — PROTEIN / CREATININE RATIO, URINE
Creatinine, Urine: 63.7 mg/dL
Protein, Ur: 9 mg/dL
Protein/Creat Ratio: 141 mg/g creat (ref 0–200)

## 2023-01-04 LAB — CBC
Hematocrit: 37.1 % (ref 34.0–46.6)
Hemoglobin: 12.8 g/dL (ref 11.1–15.9)
MCH: 30.1 pg (ref 26.6–33.0)
MCHC: 34.5 g/dL (ref 31.5–35.7)
MCV: 87 fL (ref 79–97)
Platelets: 254 10*3/uL (ref 150–450)
RBC: 4.25 x10E6/uL (ref 3.77–5.28)
RDW: 12.3 % (ref 11.7–15.4)
WBC: 11.4 10*3/uL — ABNORMAL HIGH (ref 3.4–10.8)

## 2023-01-07 ENCOUNTER — Other Ambulatory Visit: Payer: Self-pay

## 2023-01-07 ENCOUNTER — Inpatient Hospital Stay (HOSPITAL_COMMUNITY): Payer: BC Managed Care – PPO | Admitting: Anesthesiology

## 2023-01-07 ENCOUNTER — Inpatient Hospital Stay (HOSPITAL_COMMUNITY): Payer: BC Managed Care – PPO

## 2023-01-07 ENCOUNTER — Inpatient Hospital Stay (HOSPITAL_COMMUNITY)
Admission: RE | Admit: 2023-01-07 | Discharge: 2023-01-09 | DRG: 807 | Disposition: A | Discharge status: Home / Self Care | Payer: BC Managed Care – PPO | Attending: Obstetrics & Gynecology | Admitting: Obstetrics & Gynecology

## 2023-01-07 ENCOUNTER — Encounter (HOSPITAL_COMMUNITY): Payer: Self-pay | Admitting: Family Medicine

## 2023-01-07 DIAGNOSIS — Z0542 Observation and evaluation of newborn for suspected metabolic condition ruled out: Secondary | ICD-10-CM | POA: Diagnosis not present

## 2023-01-07 DIAGNOSIS — Z3A38 38 weeks gestation of pregnancy: Secondary | ICD-10-CM | POA: Diagnosis not present

## 2023-01-07 DIAGNOSIS — O26893 Other specified pregnancy related conditions, third trimester: Secondary | ICD-10-CM | POA: Diagnosis not present

## 2023-01-07 DIAGNOSIS — O165 Unspecified maternal hypertension, complicating the puerperium: Secondary | ICD-10-CM | POA: Insufficient documentation

## 2023-01-07 DIAGNOSIS — O24425 Gestational diabetes mellitus in childbirth, controlled by oral hypoglycemic drugs: Principal | ICD-10-CM | POA: Diagnosis present

## 2023-01-07 DIAGNOSIS — O34592 Maternal care for other abnormalities of gravid uterus, second trimester: Secondary | ICD-10-CM

## 2023-01-07 DIAGNOSIS — O99344 Other mental disorders complicating childbirth: Secondary | ICD-10-CM | POA: Diagnosis not present

## 2023-01-07 DIAGNOSIS — O3403 Maternal care for unspecified congenital malformation of uterus, third trimester: Secondary | ICD-10-CM | POA: Diagnosis not present

## 2023-01-07 DIAGNOSIS — Z7982 Long term (current) use of aspirin: Secondary | ICD-10-CM

## 2023-01-07 DIAGNOSIS — O99214 Obesity complicating childbirth: Secondary | ICD-10-CM | POA: Diagnosis not present

## 2023-01-07 DIAGNOSIS — Z87891 Personal history of nicotine dependence: Secondary | ICD-10-CM

## 2023-01-07 DIAGNOSIS — Z79899 Other long term (current) drug therapy: Secondary | ICD-10-CM

## 2023-01-07 DIAGNOSIS — F419 Anxiety disorder, unspecified: Secondary | ICD-10-CM | POA: Diagnosis present

## 2023-01-07 DIAGNOSIS — O24424 Gestational diabetes mellitus in childbirth, insulin controlled: Secondary | ICD-10-CM | POA: Diagnosis not present

## 2023-01-07 DIAGNOSIS — O135 Gestational [pregnancy-induced] hypertension without significant proteinuria, complicating the puerperium: Secondary | ICD-10-CM | POA: Diagnosis not present

## 2023-01-07 DIAGNOSIS — O134 Gestational [pregnancy-induced] hypertension without significant proteinuria, complicating childbirth: Secondary | ICD-10-CM | POA: Diagnosis present

## 2023-01-07 DIAGNOSIS — Z8632 Personal history of gestational diabetes: Secondary | ICD-10-CM | POA: Diagnosis present

## 2023-01-07 DIAGNOSIS — Z23 Encounter for immunization: Secondary | ICD-10-CM | POA: Diagnosis not present

## 2023-01-07 DIAGNOSIS — Q5128 Other doubling of uterus, other specified: Secondary | ICD-10-CM | POA: Diagnosis not present

## 2023-01-07 DIAGNOSIS — Z2989 Encounter for other specified prophylactic measures: Secondary | ICD-10-CM | POA: Diagnosis not present

## 2023-01-07 DIAGNOSIS — O326XX0 Maternal care for compound presentation, not applicable or unspecified: Secondary | ICD-10-CM | POA: Diagnosis not present

## 2023-01-07 DIAGNOSIS — O0992 Supervision of high risk pregnancy, unspecified, second trimester: Secondary | ICD-10-CM | POA: Diagnosis not present

## 2023-01-07 DIAGNOSIS — E669 Obesity, unspecified: Secondary | ICD-10-CM | POA: Diagnosis not present

## 2023-01-07 DIAGNOSIS — F909 Attention-deficit hyperactivity disorder, unspecified type: Secondary | ICD-10-CM | POA: Diagnosis present

## 2023-01-07 DIAGNOSIS — O24419 Gestational diabetes mellitus in pregnancy, unspecified control: Secondary | ICD-10-CM | POA: Diagnosis present

## 2023-01-07 LAB — CBC
HCT: 35.1 % — ABNORMAL LOW (ref 36.0–46.0)
Hemoglobin: 11.8 g/dL — ABNORMAL LOW (ref 12.0–15.0)
MCH: 29.9 pg (ref 26.0–34.0)
MCHC: 33.6 g/dL (ref 30.0–36.0)
MCV: 89.1 fL (ref 80.0–100.0)
Platelets: 218 10*3/uL (ref 150–400)
RBC: 3.94 MIL/uL (ref 3.87–5.11)
RDW: 12.8 % (ref 11.5–15.5)
WBC: 10.3 10*3/uL (ref 4.0–10.5)
nRBC: 0 % (ref 0.0–0.2)

## 2023-01-07 LAB — TYPE AND SCREEN
ABO/RH(D): A POS
Antibody Screen: NEGATIVE

## 2023-01-07 LAB — RPR: RPR Ser Ql: NONREACTIVE

## 2023-01-07 LAB — GLUCOSE, CAPILLARY
Glucose-Capillary: 74 mg/dL (ref 70–99)
Glucose-Capillary: 80 mg/dL (ref 70–99)
Glucose-Capillary: 82 mg/dL (ref 70–99)
Glucose-Capillary: 90 mg/dL (ref 70–99)
Glucose-Capillary: 93 mg/dL (ref 70–99)

## 2023-01-07 MED ORDER — SOD CITRATE-CITRIC ACID 500-334 MG/5ML PO SOLN: 30.0000 mL | ORAL | Status: DC | PRN

## 2023-01-07 MED ORDER — LIDOCAINE HCL (PF) 1 % IJ SOLN
INTRAMUSCULAR | Status: DC | PRN
  Administered 2023-01-07: 11 mL via EPIDURAL

## 2023-01-07 MED ORDER — FENTANYL-BUPIVACAINE-NACL 0.5-0.125-0.9 MG/250ML-% EP SOLN
12.0000 mL/h | EPIDURAL | Status: DC | PRN
  Administered 2023-01-07: 12 mL/h via EPIDURAL
  Filled 2023-01-07 (×2): qty 250

## 2023-01-07 MED ORDER — FENTANYL CITRATE (PF) 100 MCG/2ML IJ SOLN: 50.0000 ug | INTRAMUSCULAR | Status: DC | PRN

## 2023-01-07 MED ORDER — BUPIVACAINE HCL (PF) 0.25 % IJ SOLN
INTRAMUSCULAR | Status: DC | PRN
  Administered 2023-01-07 – 2023-01-08 (×2): 10 mL via EPIDURAL

## 2023-01-07 MED ORDER — PHENYLEPHRINE 80 MCG/ML (10ML) SYRINGE FOR IV PUSH (FOR BLOOD PRESSURE SUPPORT): 80.0000 ug | PREFILLED_SYRINGE | INTRAVENOUS | Status: DC | PRN

## 2023-01-07 MED ORDER — TERBUTALINE SULFATE 1 MG/ML IJ SOLN: 0.2500 mg | Freq: Once | INTRAMUSCULAR | Status: DC | PRN

## 2023-01-07 MED ORDER — LACTATED RINGERS IV SOLN: 500.0000 mL | INTRAVENOUS | Status: DC | PRN

## 2023-01-07 MED ORDER — ACETAMINOPHEN 325 MG PO TABS: 650.0000 mg | ORAL_TABLET | ORAL | Status: DC | PRN

## 2023-01-07 MED ORDER — EPHEDRINE 5 MG/ML INJ: 10.0000 mg | INTRAVENOUS | Status: DC | PRN

## 2023-01-07 MED ORDER — LACTATED RINGERS IV SOLN
500.0000 mL | Freq: Once | INTRAVENOUS | Status: AC
  Administered 2023-01-07: 500 mL via INTRAVENOUS

## 2023-01-07 MED ORDER — ESCITALOPRAM OXALATE 10 MG PO TABS
20.0000 mg | ORAL_TABLET | Freq: Every day | ORAL | Status: DC
  Administered 2023-01-08 – 2023-01-09 (×2): 20 mg via ORAL
  Filled 2023-01-07: qty 2
  Filled 2023-01-07: qty 1
  Filled 2023-01-07: qty 2
  Filled 2023-01-07: qty 1

## 2023-01-07 MED ORDER — OXYTOCIN-SODIUM CHLORIDE 30-0.9 UT/500ML-% IV SOLN
2.5000 [IU]/h | INTRAVENOUS | Status: DC
  Administered 2023-01-08: 2.5 [IU]/h via INTRAVENOUS
  Filled 2023-01-07: qty 500

## 2023-01-07 MED ORDER — OXYTOCIN BOLUS FROM INFUSION
333.0000 mL | Freq: Once | INTRAVENOUS | Status: AC
  Administered 2023-01-08: 333 mL via INTRAVENOUS

## 2023-01-07 MED ORDER — OXYTOCIN-SODIUM CHLORIDE 30-0.9 UT/500ML-% IV SOLN
1.0000 m[IU]/min | INTRAVENOUS | Status: DC
  Administered 2023-01-07: 2 m[IU]/min via INTRAVENOUS
  Filled 2023-01-07: qty 500

## 2023-01-07 MED ORDER — LACTATED RINGERS IV SOLN: INTRAVENOUS | Status: DC

## 2023-01-07 MED ORDER — MISOPROSTOL 50MCG HALF TABLET
50.0000 ug | ORAL_TABLET | Freq: Once | ORAL | Status: AC
  Administered 2023-01-07: 50 ug via ORAL
  Filled 2023-01-07: qty 1

## 2023-01-07 MED ORDER — ONDANSETRON HCL 4 MG/2ML IJ SOLN
4.0000 mg | Freq: Four times a day (QID) | INTRAMUSCULAR | Status: DC | PRN
  Administered 2023-01-08: 4 mg via INTRAVENOUS
  Filled 2023-01-07: qty 2

## 2023-01-07 MED ORDER — OXYCODONE-ACETAMINOPHEN 5-325 MG PO TABS: 1.0000 | ORAL_TABLET | ORAL | Status: DC | PRN

## 2023-01-07 MED ORDER — LIDOCAINE HCL (PF) 1 % IJ SOLN
30.0000 mL | INTRAMUSCULAR | Status: AC | PRN
  Administered 2023-01-08: 30 mL via SUBCUTANEOUS
  Filled 2023-01-07: qty 30

## 2023-01-07 MED ORDER — DIPHENHYDRAMINE HCL 50 MG/ML IJ SOLN: 12.5000 mg | INTRAMUSCULAR | Status: DC | PRN

## 2023-01-07 MED ORDER — FLEET ENEMA 7-19 GM/118ML RE ENEM: 1.0000 | ENEMA | RECTAL | Status: DC | PRN

## 2023-01-07 MED ORDER — OXYCODONE-ACETAMINOPHEN 5-325 MG PO TABS: 2.0000 | ORAL_TABLET | ORAL | Status: DC | PRN

## 2023-01-07 MED ORDER — MISOPROSTOL 25 MCG QUARTER TABLET
25.0000 ug | ORAL_TABLET | Freq: Once | ORAL | Status: AC
  Administered 2023-01-07: 25 ug via VAGINAL
  Filled 2023-01-07: qty 1

## 2023-01-07 NOTE — Anesthesia Procedure Notes (Signed)
Epidural Patient location during procedure: OB Start time: 01/07/2023 7:45 AM End time: 01/07/2023 7:57 AM  Staffing Anesthesiologist: Lowella Curb, MD Performed: anesthesiologist   Preanesthetic Checklist Completed: patient identified, IV checked, site marked, risks and benefits discussed, surgical consent, monitors and equipment checked, pre-op evaluation and timeout performed  Epidural Patient position: sitting Prep: ChloraPrep Patient monitoring: heart rate, cardiac monitor, continuous pulse ox and blood pressure Approach: midline Location: L2-L3 Injection technique: LOR saline  Needle:  Needle type: Tuohy  Needle gauge: 17 G Needle length: 9 cm Needle insertion depth: 5 cm Catheter type: closed end flexible Catheter size: 20 Guage Catheter at skin depth: 10 cm Test dose: negative  Assessment Events: blood not aspirated, injection not painful, no injection resistance, no paresthesia and negative IV test  Additional Notes Reason for block:procedure for pain

## 2023-01-07 NOTE — Progress Notes (Signed)
Jessica Rhodes is a 32 y.o. G1P0 at [redacted]w[redacted]d   Subjective: Doing well, no questions or concerns. Family at bedside and very supportive.  Objective: BP 122/77   Pulse 70   Temp 98.5 F (36.9 C) (Oral)   Resp 18   Ht 5\' 4"  (1.626 m)   Wt 106.2 kg   LMP 04/16/2022 (Exact Date)   SpO2 98%   BMI 40.18 kg/m  No intake/output data recorded. No intake/output data recorded.  FHT:  Baseline 135 UC:   occasional, predominantly uterine irritability SVE:   Dilation: 3 Effacement (%): 70 Station: -1 Exam by:: Thalia Bloodgood, CNM  Labs: Lab Results  Component Value Date   WBC 10.3 01/07/2023   HGB 11.8 (L) 01/07/2023   HCT 35.1 (L) 01/07/2023   MCV 89.1 01/07/2023   PLT 218 01/07/2023    Assessment / Plan: [name] IOL for A2GDM --Cat I tracing --GBS neg --A2GDM (Metformin held), CBGs well-controlled since admission --Pitocin infusing at 8 milliunits, continue to titrate PRN --Anticipate vaginal birth   Calvert Cantor, CNM

## 2023-01-07 NOTE — Plan of Care (Signed)

## 2023-01-07 NOTE — Progress Notes (Signed)
Jessica Rhodes is a 32 y.o. G1P0 at [redacted]w[redacted]d   Subjective: Comfortable with epidural. Denies pain, perineal pressure.   Objective: BP (!) 133/90   Pulse 83   Temp 98.5 F (36.9 C) (Oral)   Resp 18   Ht 5\' 4"  (1.626 m)   Wt 106.2 kg   LMP 04/16/2022 (Exact Date)   SpO2 98%   BMI 40.18 kg/m  No intake/output data recorded. Total I/O In: -  Out: 575 [Urine:575]  FHT:  Baseline 135, mod var, +accels, no decels UC:   q 2 -4 min SVE:   Dilation: 3.5 Effacement (%): 80 Station: -1 Exam by:: Thalia Bloodgood, CNM  Labs: Lab Results  Component Value Date   WBC 10.3 01/07/2023   HGB 11.8 (L) 01/07/2023   HCT 35.1 (L) 01/07/2023   MCV 89.1 01/07/2023   PLT 218 01/07/2023   Patient Vitals for the past 24 hrs:  BP Temp Temp src Pulse Resp SpO2 Height Weight  01/07/23 1500 120/66 -- -- 66 -- -- -- --  01/07/23 1430 (!) 133/90 98.5 F (36.9 C) Oral 83 -- -- -- --  01/07/23 1400 (!) 133/94 -- -- 76 -- -- -- --  01/07/23 1330 (!) 136/90 -- -- 77 -- -- -- --    Assessment / Plan: [name] IOL for A2GDM --Cat I tracing --GBS Neg --Metformin held, CBGs WNL --Brief mildly elevated BP readings, no history of HTN, continue to monitor --AROM clear fluid, fetal head very well applied, small amount clear fluid --Preemptive discussion: IUPC for improved tracing quality, Amnioinfusion if poor response to AROM --Pitocin infusing at 14 milliunits, continue titration PRN --Epidural in place and effective --Anticipate vaginal birth  Calvert Cantor, [place] 01/07/2023, 3:10 PM

## 2023-01-07 NOTE — Progress Notes (Signed)
Jessica Rhodes is a 32 y.o. G1P0 at [redacted]w[redacted]d   Subjective: S/p epidural and comfortable. Feeling rested and enjoying visiting with family at bedside.  Objective: BP 122/77   Pulse 70   Temp 98.8 F (37.1 C) (Oral)   Resp 18   Ht 5\' 4"  (1.626 m)   Wt 106.2 kg   LMP 04/16/2022 (Exact Date)   SpO2 98%   BMI 40.18 kg/m  No intake/output data recorded. No intake/output data recorded.  FHT:  Baseline 140, mod var, + accels, no decels UC:   q 2-4 min SVE:   Dilation: 3 Effacement (%): 70 Station: -1 Exam by:: Thalia Bloodgood, CNM  Labs: Lab Results  Component Value Date   WBC 10.3 01/07/2023   HGB 11.8 (L) 01/07/2023   HCT 35.1 (L) 01/07/2023   MCV 89.1 01/07/2023   PLT 218 01/07/2023    Assessment / Plan: --CNM at bedside for shift introduction to patient and family --Cat I tracing --Comfortable with epidural --Pitocin initiated at 0545, continue titration PRN --A2GDM, CBGs WNL since admission, continue q 4 hours until active labor --Will return to bedside for cervical exam around 10am (~ 4 hours from previous)   Calvert Cantor, CNM

## 2023-01-07 NOTE — Anesthesia Preprocedure Evaluation (Signed)
Anesthesia Evaluation  Patient identified by MRN, date of birth, ID band Patient awake    Reviewed: Allergy & Precautions, H&P , NPO status , Patient's Chart, lab work & pertinent test results  Airway Mallampati: II  TM Distance: >3 FB Neck ROM: Full    Dental no notable dental hx. (+) Teeth Intact, Dental Advisory Given   Pulmonary neg pulmonary ROS, former smoker   Pulmonary exam normal breath sounds clear to auscultation       Cardiovascular Exercise Tolerance: Good negative cardio ROS Normal cardiovascular exam Rhythm:Regular Rate:Normal     Neuro/Psych   Anxiety     negative neurological ROS     GI/Hepatic negative GI ROS, Neg liver ROS,,,  Endo/Other  negative endocrine ROSdiabetes    Renal/GU negative Renal ROS  negative genitourinary   Musculoskeletal negative musculoskeletal ROS (+)    Abdominal  (+) + obese  Peds negative pediatric ROS (+)  Hematology negative hematology ROS (+)   Anesthesia Other Findings   Reproductive/Obstetrics (+) Pregnancy                             Anesthesia Physical Anesthesia Plan  ASA: 3  Anesthesia Plan: Epidural   Post-op Pain Management:    Induction:   PONV Risk Score and Plan:   Airway Management Planned:   Additional Equipment:   Intra-op Plan:   Post-operative Plan:   Informed Consent: I have reviewed the patients History and Physical, chart, labs and discussed the procedure including the risks, benefits and alternatives for the proposed anesthesia with the patient or authorized representative who has indicated his/her understanding and acceptance.       Plan Discussed with:   Anesthesia Plan Comments: (  )        Anesthesia Quick Evaluation

## 2023-01-07 NOTE — Progress Notes (Signed)
Jessica Rhodes is a 32 y.o. G1P0 at [redacted]w[redacted]d   Subjective: Patient in right lateral, new onset intense perineal pressure. No improvement in pain score s/p redosing by Anesthesia.  Objective: BP 119/69   Pulse 69   Temp 98.2 F (36.8 C) (Oral)   Resp 18   Ht 5\' 4"  (1.626 m)   Wt 106.2 kg   LMP 04/16/2022 (Exact Date)   SpO2 98%   BMI 40.18 kg/m  I/O last 3 completed shifts: In: -  Out: 975 [Urine:975] No intake/output data recorded.  FHT:  Baseline 130, mod var, no accels, no decels (immediately s/p redosing of epidural) UC:   q 2 min SVE:   Dilation: 9 Effacement (%): 100 Station: 0 Exam by:: Thalia Bloodgood, CNM  Labs: Lab Results  Component Value Date   WBC 10.3 01/07/2023   HGB 11.8 (L) 01/07/2023   HCT 35.1 (L) 01/07/2023   MCV 89.1 01/07/2023   PLT 218 01/07/2023    Assessment / Plan: --CNM at bedside for acute onset perineal pain without urge to push --Now 9/100/0 --Anticipate imminent vaginal delivery  Calvert Cantor, CNM 01/07/2023, 7:52 PM

## 2023-01-07 NOTE — H&P (Signed)
OBSTETRIC ADMISSION HISTORY AND PHYSICAL  Jessica Rhodes is a 32 y.o. female G1P0 with IUP at [redacted]w[redacted]d by LMP presenting for IOL. She reports +FMs, No LOF, no VB, no blurry vision, headaches or peripheral edema, and RUQ pain.  She plans on breast and bottle feeding. She request nothing for birth control. She received her prenatal care at  [facility]    Dating: By LMP --->  Estimated Date of Delivery: 01/21/23  Sono:    @[redacted]w[redacted]d , CWD, normal anatomy, cephalic presentation, anterior placental lie, 2329g, 40% EFW   Prenatal History/Complications:  -Septate uterus -A2GDM -ADHD, anxiety -Hx of bilateral breast reduction   Past Medical History: Past Medical History:  Diagnosis Date   Allergy    Anxiety    Gestational diabetes     Past Surgical History: Past Surgical History:  Procedure Laterality Date   BREAST REDUCTION SURGERY Bilateral 05/05/2020   Procedure: MAMMARY REDUCTION  (BREAST);  Surgeon: Allena Napoleon, MD;  Location: [facility];  Service: Plastics;  Laterality: Bilateral;   TONSILECTOMY/ADENOIDECTOMY WITH MYRINGOTOMY     WISDOM TOOTH EXTRACTION  2016    Obstetrical History: OB History     Gravida  1   Para      Term      Preterm      AB      Living         SAB      IAB      Ectopic      Multiple      Live Births              Social History Social History   Socioeconomic History   Marital status: Married    Spouse name: Not on file   Number of children: Not on file   Years of education: Not on file   Highest education level: Not on file  Occupational History   Not on file  Tobacco Use   Smoking status: Former    Types: Cigarettes    Quit date: 09/26/2019    Years since quitting: 3.2   Smokeless tobacco: Never  Vaping Use   Vaping Use: Former   Quit date: 04/11/2022   Substances: Nicotine  Substance and Sexual Activity   Alcohol use: Not Currently    Comment: socially   Drug use: No   Sexual activity: Yes    Birth  control/protection: Pill  Other Topics Concern   Not on file  Social History Narrative   Not on file   Social Determinants of Health   Financial Resource Strain: Not on file  Food Insecurity: Not on file  Transportation Needs: Not on file  Physical Activity: Not on file  Stress: Not on file  Social Connections: Not on file    Family History: Family History  Problem Relation Age of Onset   Cancer Paternal Grandmother    Heart disease Paternal Grandfather    Diabetes Paternal Grandfather     Allergies: No Known Allergies  Medications Prior to Admission  Medication Sig Dispense Refill Last Dose   acetaminophen (TYLENOL) 500 MG tablet Take 1,000 mg by mouth every 6 (six) hours as needed.      aspirin EC 81 MG tablet Take 1 tablet (81 mg total) by mouth daily. Swallow whole. 90 tablet 2    Blood Glucose Monitoring Suppl (ONETOUCH VERIO) w/Device KIT 1 Device by Does not apply route as needed. 1 kit 0    escitalopram (LEXAPRO) 20 MG tablet Take 20  mg by mouth daily.      glucose blood (ONETOUCH VERIO) test strip Use four times per day as instructed 100 each 12    Lancets (ONETOUCH ULTRASOFT) lancets Use four times per day as instructed 100 each 12    LORazepam (ATIVAN) 2 MG tablet Take by mouth as needed.      Magnesium Oxide -Mg Supplement (MAG-OXIDE) 200 MG TABS Take 2 tablets (400 mg total) by mouth at bedtime. If that amount causes loose stools in the am, switch to 200mg  daily at bedtime. 60 tablet 3    metFORMIN (GLUCOPHAGE) 500 MG tablet Take 2 tablets (1,000 mg total) by mouth daily with breakfast. 60 tablet 5    Prenatal Vit-Fe Fumarate-FA (PRENATAL MULTIVITAMIN) TABS tablet Take 1 tablet by mouth daily at 12 noon.        Review of Systems   All systems reviewed and negative except as stated in HPI  Last menstrual period 04/16/2022. General appearance: alert and no distress Lungs: normal effort Heart: regular rate Abdomen: gravid Pelvic: 1.5/60,70/-2 Extremities:  No LE edema Presentation: cephalic Fetal monitoringBaseline: 140 bpm, Variability: Good {> 6 bpm), Accelerations: Reactive, and Decelerations: Absent Uterine activityFrequency: irritability     Prenatal labs: ABO, Rh: --/--/PENDING (04/28 0050) Antibody: PENDING (04/28 0050) Rubella: 4.64 (10/30 1555) RPR: Non Reactive (02/28 0851)  HBsAg: Negative (10/30 1555)  HIV: Non Reactive (02/28 0851)  GBS: Negative/-- (04/18 0803)  1 hr Glucola 243 Genetic screening  LR, neg Anatomy US female, wnl  Prenatal Transfer Tool  Maternal Diabetes: Yes:  Diabetes Type:  Insulin/Medication controlled Genetic Screening: Normal Maternal Ultrasounds/Referrals: Other: Septate uterus Fetal Ultrasounds or other Referrals:  Referred to Materal Fetal Medicine  Maternal Substance Abuse:  No Significant Maternal Medications:  Meds include: Other:  Lexapro Significant Maternal Lab Results:  Group B Strep negative Number of Prenatal Visits:greater than 3 verified prenatal visits Other Comments:  None  Results for orders placed or performed during the hospital encounter of 01/07/23 (from the past 24 hour(s))  Type and screen   Collection Time: 01/07/23 12:50 AM  Result Value Ref Range   ABO/RH(D) PENDING    Antibody Screen PENDING    Sample Expiration      01/10/2023,2359 Performed at [facility], 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 G1P0 at [redacted]w[redacted]d here for IOL 2/2 A2GDM with elevated post prandial readings.    #Labor: Start with cytotec 50/25. Consider FB at next exam.  #Pain: Maternally supported #FWB: Cat I  #ID:  GBS neg #MOF: Both #MOC: Declined #Circ:  Yes  A2GDM Metformin 1000mg  daily. Hold home.  -CBG monitoring q4h latent, q2h active  Anxiety -Continue home lexapro 20 mg daily  Aldwin Micalizzi Autry-Lott, DO  01/07/2023, 1:07 AM

## 2023-01-07 NOTE — Progress Notes (Signed)
Labor Progress Note Jessica Rhodes is a 32 y.o. G1P0 at [redacted]w[redacted]d presented for IOL.   S: No acute concerns.   O:  BP 124/68 (BP Location: Left Arm)   Pulse 64   Temp 98.6 F (37 C) (Oral)   Resp 16   Ht 5\' 4"  (1.626 m)   Wt 106.2 kg   LMP 04/16/2022 (Exact Date)   SpO2 100%   BMI 40.18 kg/m  EFM: 155bpm/moderate/+accels, no decels  CVE: Dilation: 2 Effacement (%): 70, 80 Cervical Position: Posterior Station: -2 Presentation: Vertex Exam by:: A.Mathis RN   A&P: 32 y.o. G1P0 [redacted]w[redacted]d here for IOL 2/2 poorly controlled A2GDM.  #Labor: Progressing well. Start pit 2 by 2 and consider AROM at next cervical exam.  #Pain: Planning for epidural #FWB: Cat I, difficult tracing. Consider need for FSE #GBS negative  A2GDM Most recent CBG 93 Metformin 1000mg  daily. Hold home med.   -CBG monitoring q4h latent, q2h active   Anxiety -Continue home lexapro 20 mg daily    Taquanna Borras Autry-Lott, DO 5:44 AM

## 2023-01-07 NOTE — Progress Notes (Signed)
Jessica Rhodes is a 32 y.o. G1P0 at [redacted]w[redacted]d   Subjective: Reporting new onset pelvic pressure. Denies urge to push. Otherwise comfortable with epidural.  Objective: BP 119/82   Pulse 75   Temp 98.2 F (36.8 C) (Oral)   Resp 18   Ht 5\' 4"  (1.626 m)   Wt 106.2 kg   LMP 04/16/2022 (Exact Date)   SpO2 98%   BMI 40.18 kg/m  No intake/output data recorded. Total I/O In: -  Out: 575 [Urine:575]  FHT:  Baseline 130, mod var, + accels, no decels UC:   q 2-3 min SVE:   Dilation: 6.5 Effacement (%): 100 Station: -1 Exam by:: Thalia Bloodgood, CNM  Labs: Lab Results  Component Value Date   WBC 10.3 01/07/2023   HGB 11.8 (L) 01/07/2023   HCT 35.1 (L) 01/07/2023   MCV 89.1 01/07/2023   PLT 218 01/07/2023    Assessment / Plan: [name]  --Cat I tracing --S/p AROM at 1445 --clear fluid diluting bloody show, patient reassured --IUPC placed without difficulty per RN request and to facilitate position changes --Pitocin infusing at 14 milliunits, begin MVUs --Anticipate vaginal birth  Calvert Cantor, [place] 01/07/2023, 6:22 PM

## 2023-01-08 ENCOUNTER — Encounter (HOSPITAL_COMMUNITY): Payer: Self-pay | Admitting: Family Medicine

## 2023-01-08 ENCOUNTER — Ambulatory Visit: Payer: BC Managed Care – PPO

## 2023-01-08 DIAGNOSIS — O326XX0 Maternal care for compound presentation, not applicable or unspecified: Secondary | ICD-10-CM

## 2023-01-08 DIAGNOSIS — O24424 Gestational diabetes mellitus in childbirth, insulin controlled: Secondary | ICD-10-CM

## 2023-01-08 DIAGNOSIS — O135 Gestational [pregnancy-induced] hypertension without significant proteinuria, complicating the puerperium: Secondary | ICD-10-CM

## 2023-01-08 DIAGNOSIS — Z3A38 38 weeks gestation of pregnancy: Secondary | ICD-10-CM

## 2023-01-08 DIAGNOSIS — O99344 Other mental disorders complicating childbirth: Secondary | ICD-10-CM

## 2023-01-08 LAB — GLUCOSE, CAPILLARY: Glucose-Capillary: 125 mg/dL — ABNORMAL HIGH (ref 70–99)

## 2023-01-08 MED ORDER — ACETAMINOPHEN 325 MG PO TABS
650.0000 mg | ORAL_TABLET | ORAL | Status: DC | PRN
  Administered 2023-01-08: 650 mg via ORAL
  Filled 2023-01-08: qty 2

## 2023-01-08 MED ORDER — IBUPROFEN 600 MG PO TABS
600.0000 mg | ORAL_TABLET | Freq: Four times a day (QID) | ORAL | Status: DC
  Administered 2023-01-08 – 2023-01-09 (×6): 600 mg via ORAL
  Filled 2023-01-08 (×6): qty 1

## 2023-01-08 MED ORDER — SENNOSIDES-DOCUSATE SODIUM 8.6-50 MG PO TABS
2.0000 | ORAL_TABLET | Freq: Every day | ORAL | Status: DC
  Administered 2023-01-09: 2 via ORAL
  Filled 2023-01-08: qty 2

## 2023-01-08 MED ORDER — WITCH HAZEL-GLYCERIN EX PADS: 1.0000 | MEDICATED_PAD | CUTANEOUS | Status: DC | PRN

## 2023-01-08 MED ORDER — TETANUS-DIPHTH-ACELL PERTUSSIS 5-2.5-18.5 LF-MCG/0.5 IM SUSY: 0.5000 mL | PREFILLED_SYRINGE | Freq: Once | INTRAMUSCULAR | Status: DC

## 2023-01-08 MED ORDER — ONDANSETRON HCL 4 MG PO TABS: 4.0000 mg | ORAL_TABLET | ORAL | Status: DC | PRN

## 2023-01-08 MED ORDER — PRENATAL MULTIVITAMIN CH
1.0000 | ORAL_TABLET | Freq: Every day | ORAL | Status: DC
  Administered 2023-01-08 – 2023-01-09 (×2): 1 via ORAL
  Filled 2023-01-08 (×2): qty 1

## 2023-01-08 MED ORDER — DIPHENHYDRAMINE HCL 25 MG PO CAPS: 25.0000 mg | ORAL_CAPSULE | Freq: Four times a day (QID) | ORAL | Status: DC | PRN

## 2023-01-08 MED ORDER — ZOLPIDEM TARTRATE 5 MG PO TABS: 5.0000 mg | ORAL_TABLET | Freq: Every evening | ORAL | Status: DC | PRN

## 2023-01-08 MED ORDER — DIBUCAINE (PERIANAL) 1 % EX OINT: 1.0000 | TOPICAL_OINTMENT | CUTANEOUS | Status: DC | PRN

## 2023-01-08 MED ORDER — SIMETHICONE 80 MG PO CHEW: 80.0000 mg | CHEWABLE_TABLET | ORAL | Status: DC | PRN

## 2023-01-08 MED ORDER — ONDANSETRON HCL 4 MG/2ML IJ SOLN: 4.0000 mg | INTRAMUSCULAR | Status: DC | PRN

## 2023-01-08 MED ORDER — BENZOCAINE-MENTHOL 20-0.5 % EX AERO
1.0000 | INHALATION_SPRAY | CUTANEOUS | Status: DC | PRN
  Administered 2023-01-08: 1 via TOPICAL
  Filled 2023-01-08: qty 56

## 2023-01-08 MED ORDER — COCONUT OIL OIL
1.0000 | TOPICAL_OIL | Status: DC | PRN
  Administered 2023-01-08: 1 via TOPICAL

## 2023-01-08 NOTE — Discharge Summary (Signed)
Postpartum Discharge Summary  Date of Service updated***     Patient Name: Jessica Rhodes DOB: June 05, 1991 MRN: [id number]  Date of admission: 01/07/2023 Delivery date:01/08/2023  Delivering provider: Edd Arbour R  Date of discharge: 01/08/2023  Admitting diagnosis: Indication for care in labor or delivery [O75.9] Intrauterine pregnancy: [redacted]w[redacted]d     Secondary diagnosis:  Principal Problem:   Indication for care in labor or delivery  Additional problems: ***    Discharge diagnosis: {DX.:23714}                                              Post partum procedures:{Postpartum procedures:23558} Augmentation: AROM, Pitocin, and Cytotec Complications: Inadequate anesthesia requiring replacement of catheter  Hospital course: Induction of Labor With Vaginal Delivery   32 y.o. yo G1P0 at 109w1d was admitted to the hospital 01/07/2023 for induction of labor.  Indication for induction: A2 DM.  Patient had an labor course complicated by inadequate anesthesia and poor improvement in station with pushing.  Membrane Rupture Time/Date: 2:52 PM ,01/07/2023   Delivery Method:Vaginal, Vacuum (Extractor)  Episiotomy: None  Lacerations:  2nd degree;Perineal  Details of delivery can be found in separate delivery note.  Patient had a postpartum course complicated by***. Patient is discharged home 01/08/23.  Newborn Data: Birth date:01/08/2023  Birth time:1:57 AM  Gender:Female  Living status:Living  Apgars:7 ,9  Weight:   Magnesium Sulfate received: No BMZ received: No Rhophylac:No MMR:{MMR:30440033} T-DaP:{Tdap:23962} Flu: {WUJ:81191} Transfusion:{Transfusion received:30440034}  Physical exam  Vitals:   01/08/23 0131 01/08/23 0217 01/08/23 0248 01/08/23 0301  BP: 118/67 106/87 124/73 127/77  Pulse: 90 98 78 94  Resp: 17 18 18 17   Temp:      TempSrc:      SpO2:      Weight:      Height:       General: {Exam; general:21111117} Lochia: {Desc;  appropriate/inappropriate:30686::"appropriate"} Uterine Fundus: {Desc; firm/soft:30687} Incision: {Exam; incision:21111123} DVT Evaluation: {Exam; dvt:2111122} Labs: Lab Results  Component Value Date   WBC 10.3 01/07/2023   HGB 11.8 (L) 01/07/2023   HCT 35.1 (L) 01/07/2023   MCV 89.1 01/07/2023   PLT 218 01/07/2023      Latest Ref Rng & Units 01/03/2023    4:56 PM  CMP  Glucose 70 - 99 mg/dL 98   BUN 6 - 20 mg/dL 9   Creatinine 4.78 - 2.95 mg/dL 6.21   Sodium 308 - 657 mmol/L 137   Potassium 3.5 - 5.2 mmol/L 4.5   Chloride 96 - 106 mmol/L 106   CO2 20 - 29 mmol/L 18   Calcium 8.7 - 10.2 mg/dL 9.3   Total Protein 6.0 - 8.5 g/dL 5.8   Total Bilirubin 0.0 - 1.2 mg/dL <8.4   Alkaline Phos 44 - 121 IU/L 164   AST 0 - 40 IU/L 17   ALT 0 - 32 IU/L 10    Edinburgh Score:     No data to display           After visit meds:  Allergies as of 01/08/2023   No Known Allergies   Med Rec must be completed prior to using this [facility]***        Discharge home in stable condition Infant Feeding: {Baby feeding:23562} Infant Disposition:{CHL IP OB HOME WITH ONGEXB:28413} Discharge instruction: per After Visit Summary and Postpartum booklet. Activity: Advance as tolerated.  Pelvic rest for 6 weeks.  Diet: {OB ZOXW:96045409} Future Appointments:No future appointments. Follow up Visit:  Message sent to [facility] by Autry-Lott on 01/08/2023  Please schedule this patient for a In person postpartum visit in 6 weeks with the following provider:  Edd Arbour . Additional Postpartum F/U:Postpartum Depression checkup and 2 hour GTT  High risk pregnancy complicated by:  A2GDM, septate uterus, anxiety Delivery mode:  Vaginal, Vacuum Investment banker, operational)  Anticipated Birth Control:   Declined   01/08/2023 Simone Autry-Lott, DO

## 2023-01-08 NOTE — Progress Notes (Signed)
Labor Progress Note LILYANNE MCQUOWN is a 32 y.o. G1P0 at [redacted]w[redacted]d presented for IOL.   S: Expresses being unable to push at this time. It is distressing to her. She would like to see if she can have her epidural replaced.   O:  BP 136/84   Pulse 84   Temp 98 F (36.7 C) (Oral)   Resp 18   Ht 5\' 4"  (1.626 m)   Wt 106.2 kg   LMP 04/16/2022 (Exact Date)   SpO2 98%   BMI 40.18 kg/m  EFM: 135bpm/moderate/+accels, no decels  CVE: Dilation: 10 Dilation Complete Date: 01/07/23 Dilation Complete Time: 2055 Effacement (%): 100 Cervical Position: Posterior Station: Plus 2 Presentation: Vertex Exam by:: Thalia Bloodgood CNM  A&P: 32 y.o. G1P0 [redacted]w[redacted]d here for IOL 2/2 poorly controlled A2GDM.  #Labor: Plan to resume pushing when anesthesia adequate.  #Pain: Epidural #FWB: Cat I #GBS negative  A2GDM Most recent CBG 80 Metformin 1000mg  daily. Hold home med.   -CBG monitoring q2h   Anxiety -Continue home lexapro 20 mg daily   Kyndal Heringer Autry-Lott, DO 12:32 AM

## 2023-01-08 NOTE — Progress Notes (Signed)
Patient ID: Jessica Rhodes, female   DOB: Mar 06, 1991, 32 y.o.   MRN: [id number]  Labor Progress Note LOURA PITT is a 32 y.o. G1P0 at [redacted]w[redacted]d presented for IOL for A2GDM (metformin)  S:  Pt now much more comfortable with epidural re-dosed. Amenable to trying pushing once she feels more numb. Does not want to push to the point of exhaustion/another panic attack but is amenable to a vacuum assist if needed.  O:  BP 127/75   Pulse 84   Temp 99.1 F (37.3 C) (Oral)   Resp 18   Ht 5\' 4"  (1.626 m)   Wt 234 lb 1.6 oz (106.2 kg)   LMP 04/16/2022 (Exact Date)   SpO2 98%   BMI 40.18 kg/m  EFM: baseline 130 bpm/ moderate variability/ 15x15 accels/ variable decels with pushing Toco/IUPC: q3-10min SVE: Dilation: 10 Dilation Complete Date: 01/07/23 Dilation Complete Time: 2055 Effacement (%): 100 Cervical Position: Posterior Station: Plus 2 Presentation: Vertex Exam by:: Thalia Bloodgood CNM Pitocin: 10 mu/min  A/P: 32 y.o. G1P0 [redacted]w[redacted]d  1. Labor: Pushing phase 2. FWB: Cat 1 3. Pain: well-controlled with re-dosed epidural 4. A2GDM: last CBG - 80  Pt able to push well with contractions, but after of pushing with little fetal descent, she became more tired and requested assistance. Drs. Shawnie Pons and Autry-Lott called to the room to assess for vacuum assist, care turned over to them for delivery.  Edd Arbour, CNM, MSN, IBCLC Certified Nurse Midwife, [facility]

## 2023-01-08 NOTE — Plan of Care (Signed)
  Problem: Education: Goal: Knowledge of General Education information will improve Description: Including pain rating scale, medication(s)/side effects and non-pharmacologic comfort measures Outcome: Completed/Met   Problem: Health Behavior/Discharge Planning: Goal: Ability to manage health-related needs will improve Outcome: Completed/Met   Problem: Clinical Measurements: Goal: Ability to maintain clinical measurements within normal limits will improve Outcome: Completed/Met Goal: Will remain free from infection Outcome: Completed/Met Goal: Diagnostic test results will improve Outcome: Completed/Met Goal: Respiratory complications will improve Outcome: Completed/Met Goal: Cardiovascular complication will be avoided Outcome: Completed/Met   Problem: Activity: Goal: Risk for activity intolerance will decrease Outcome: Completed/Met   Problem: Nutrition: Goal: Adequate nutrition will be maintained Outcome: Completed/Met   Problem: Coping: Goal: Level of anxiety will decrease Outcome: Completed/Met   Problem: Elimination: Goal: Will not experience complications related to bowel motility Outcome: Completed/Met Goal: Will not experience complications related to urinary retention Outcome: Completed/Met   Problem: Pain Managment: Goal: General experience of comfort will improve Outcome: Completed/Met   Problem: Safety: Goal: Ability to remain free from injury will improve Outcome: Completed/Met   Problem: Skin Integrity: Goal: Risk for impaired skin integrity will decrease Outcome: Completed/Met   Problem: Education: Goal: Knowledge of Childbirth will improve Outcome: Completed/Met Goal: Ability to make informed decisions regarding treatment and plan of care will improve Outcome: Completed/Met Goal: Ability to state and carry out methods to decrease the pain will improve Outcome: Completed/Met Goal: Individualized Educational Video(s) Outcome: Completed/Met    Problem: Coping: Goal: Ability to verbalize concerns and feelings about labor and delivery will improve Outcome: Completed/Met   Problem: Life Cycle: Goal: Ability to make normal progression through stages of labor will improve Outcome: Completed/Met Goal: Ability to effectively push during vaginal delivery will improve Outcome: Completed/Met   Problem: Role Relationship: Goal: Will demonstrate positive interactions with the child Outcome: Completed/Met   Problem: Safety: Goal: Risk of complications during labor and delivery will decrease Outcome: Completed/Met   Problem: Pain Management: Goal: Relief or control of pain from uterine contractions will improve Outcome: Completed/Met   Problem: Education: Goal: Knowledge of condition will improve Outcome: Completed/Met Goal: Individualized Educational Video(s) Outcome: Completed/Met Goal: Individualized Newborn Educational Video(s) Outcome: Completed/Met   Problem: Activity: Goal: Will verbalize the importance of balancing activity with adequate rest periods Outcome: Completed/Met Goal: Ability to tolerate increased activity will improve Outcome: Completed/Met   Problem: Coping: Goal: Ability to identify and utilize available resources and services will improve Outcome: Completed/Met   Problem: Life Cycle: Goal: Chance of risk for complications during the postpartum period will decrease Outcome: Completed/Met   Problem: Role Relationship: Goal: Ability to demonstrate positive interaction with newborn will improve Outcome: Completed/Met   Problem: Skin Integrity: Goal: Demonstration of wound healing without infection will improve Outcome: Completed/Met   

## 2023-01-08 NOTE — Anesthesia Postprocedure Evaluation (Signed)
Anesthesia Post Note  Patient: Jessica Rhodes  Procedure(s) Performed: AN AD HOC LABOR EPIDURAL     Patient location during evaluation: Mother Baby Anesthesia Type: Epidural Level of consciousness: awake and alert Pain management: pain level controlled Vital Signs Assessment: post-procedure vital signs reviewed and stable Respiratory status: spontaneous breathing, nonlabored ventilation and respiratory function stable Cardiovascular status: stable Postop Assessment: no headache, no backache and epidural receding Anesthetic complications: no   No notable events documented.  Last Vitals:  Vitals:   01/08/23 0515 01/08/23 0607  BP: 131/87 131/84  Pulse: 81 77  Resp: 18 18  Temp: 36.9 C 36.8 C  SpO2:      Last Pain:  Vitals:   01/08/23 0607  TempSrc: Oral  PainSc:    Pain Goal: Patients Stated Pain Goal: 0 (01/07/23 1933)                 Fanny Dance

## 2023-01-08 NOTE — Progress Notes (Signed)
Patient ID: EMELYN ROEN, female   DOB: [birth year], 32 y.o.   MRN: [id number]  Labor Progress Note TIFANNY DOLLENS is a 32 y.o. G1P0 at [redacted]w[redacted]d presented for IOL for A2GDM (metformin, poorly controlled)  S:  Pt uncomfortable with urge to push. FOB, MGM and sister at bedside for support.  O:  BP 127/75   Pulse 84   Temp 99.1 F (37.3 C) (Oral)   Resp 18   Ht 5\' 4"  (1.626 m)   Wt 234 lb 1.6 oz (106.2 kg)   LMP 04/16/2022 (Exact Date)   SpO2 98%   BMI 40.18 kg/m  EFM: baseline 125 bpm/ moderate variability/ 15x15 accels/ no decels  Toco/IUPC: q71min SVE: Dilation: 10 Dilation Complete Date: 01/07/23 Dilation Complete Time: 2055 Effacement (%): 100 Cervical Position: Posterior Station: Plus 2 Presentation: Vertex Exam by:: Thalia Bloodgood CNM Pitocin: 16 mu/min  A/P: 32 y.o. G1P0 [redacted]w[redacted]d  1. Labor: Complete, urge to push but having trouble pushing due to intense rectal/back pain.  2. FWB: Cat 1 3. Pain: Poorly controlled during pushing phase 4. A2GDM: last CBG - 74  Attempted pushing with patient for about . Pt became increasingly more uncomfortable with pushing, complaining of intense pressure/pain. Began crying and begging to stop - turned Pitocin down to 26mu/min and gave her button for bolus of epidural medicine. Got her into a better position (throne) and suggested resting and allowing baby to labor down. Will reattempt pushing once more comfortable. Anticipate SVD.  Edd Arbour, CNM, MSN, IBCLC Certified Nurse Midwife, [facility]

## 2023-01-08 NOTE — Lactation Note (Signed)
This note was copied from a baby's chart. Lactation Consultation Note  Patient Name: Jessica Rhodes [id number] Date: 01/08/2023 Age:33 hours  Reason for consult: Initial assessment;Primapara;1st time breastfeeding;Early term 37-38.6wks;Breast reduction;Maternal endocrine disorder  P1, GA [redacted]w[redacted]d, GDM, initial blood sugar 24, breast reduction  Mother is receptive to [facility] visit and visitor stepped out of room upon my entry. Mother is motivated to breastfeed and wants help with latching and positions. Baby recently had formula and sleeping. She is also aware that following bilateral breast milk reduction that it is uncertain about milk production or volume. Mother states she has been able to hand express drops of colostrum from her right breast, reports some breast changes in pregnancy including size and sensitivity.   Mother is supplementing with formula as she works on breastfeeding and breast milk stimulation with pumping.   DEBP set up with instructions for pumping every 3 hours for 15 minutes in the initiation phase, cleaning, collection and storage. Father attentive with education. Educated about a curved tip syringe for drawing up small pumped quantities and for feeding. Instructed to call nurse for first feeding/use of curved tip syringe.   Brief demonstration of pumping with the right breast.Flange size, 21 mm, was a correct fit.  Skin on surface of nipple dimples and with only a few minutes of pumping, inner skin was exposed. Discussed using a lower setting to avoid skin breakdown, apply expressed milk to nipple post pumping and coconut oil when pumping and afterwards.   Mom made aware of O/P services, breastfeeding support groups,  and our phone # for post-discharge questions.    Maternal Data Has patient been taught Hand Expression?: Yes Does the patient have breastfeeding experience prior to this delivery?: No  Feeding Mother's Current Feeding Choice: Breast Milk and  Formula  Lactation Tools Discussed/Used Tools: Pump;Flanges;Coconut oil;Bottle;Other (comment) (curved tip syringe for finger feeding/ collect and put in spoon) Flange Size: 21 Breast pump type: Double-Electric Breast Pump Pump Education: Setup, frequency, and cleaning;Milk Storage Reason for Pumping: bilateral breast reduction 2021 Pumping frequency: every 3 hours for 15 min  Interventions  education  Discharge Pump: Hands Free  Consult Status Consult Status: Follow-up Date: 01/09/23 Follow-up type: In-patient    Christella Hartigan M 01/08/2023, 7:04 PM

## 2023-01-09 ENCOUNTER — Encounter: Payer: BC Managed Care – PPO | Admitting: Student

## 2023-01-09 ENCOUNTER — Other Ambulatory Visit (HOSPITAL_COMMUNITY): Payer: Self-pay

## 2023-01-09 DIAGNOSIS — O165 Unspecified maternal hypertension, complicating the puerperium: Secondary | ICD-10-CM | POA: Insufficient documentation

## 2023-01-09 MED ORDER — FUROSEMIDE 20 MG PO TABS
20.0000 mg | ORAL_TABLET | Freq: Every day | ORAL | 0 refills | Status: DC
  Filled 2023-01-09: qty 5, 5d supply, fill #0

## 2023-01-09 MED ORDER — NIFEDIPINE ER 30 MG PO TB24
30.0000 mg | ORAL_TABLET | Freq: Every day | ORAL | 11 refills | Status: DC
  Filled 2023-01-09: qty 30, 30d supply, fill #0

## 2023-01-09 MED ORDER — ACETAMINOPHEN 325 MG PO TABS
650.0000 mg | ORAL_TABLET | ORAL | 0 refills | Status: DC | PRN
  Filled 2023-01-09: qty 60, 5d supply, fill #0

## 2023-01-09 MED ORDER — IBUPROFEN 600 MG PO TABS
600.0000 mg | ORAL_TABLET | Freq: Four times a day (QID) | ORAL | 0 refills | Status: DC
  Filled 2023-01-09: qty 30, 8d supply, fill #0

## 2023-01-09 NOTE — Social Work (Signed)
MOB was referred for history of depression/anxiety.  * Referral screened out by Clinical Social Worker because none of the following criteria appear to apply:  ~ History of anxiety/depression during this pregnancy, or of post-partum depression following prior delivery.  ~ Diagnosis of anxiety and/or depression within last 3 years OR * MOB's symptoms currently being treated with medication and/or therapy. Per chart review MOB has an active prescription for Lexapro and Ativan.  Please contact the Clinical Social Worker if needs arise, or by MOB request.  Wende Neighbors, LCSWA Clinical Social Worker [phone]

## 2023-01-09 NOTE — Lactation Note (Signed)
This note was copied from a baby's chart. Lactation Consultation Note  Patient Name: Jessica Rhodes [id number] Date: 01/09/2023 Age:33 hours  Follow up, breast reduction  P1, GA [redacted]w[redacted]d, GDM, 3% weight loss  Mother in cheerful spirits and states they will be going home today. Mother reports pumping twice and getting a few drops of colostrum. She plans to continue pumping after discharge to promote milk production. She reports baby is bottle feeding well and tolerating formula. Denies any questions or concerns.  Reviewed [facility] O/P services, breastfeeding support groups, [facility] OP Lactation support/ appointment, and our phone # for post-discharge questions.       Feeding Mother's Current Feeding Choice: Breast Milk and Formula   Lactation Tools Discussed/Used Pumped volume:  (few drops)  Consult Status Consult Status: Complete Date: 01/10/23    Jessica Rhodes 01/09/2023, 12:25 PM

## 2023-01-09 NOTE — Progress Notes (Signed)
Circumcision Consent  Discussed with mom at bedside about circumcision.   Circumcision is a surgery that removes the skin that covers the tip of the penis, called the "foreskin." Circumcision is usually done when a boy is between 1 and 10 days old, sometimes up to 3-4 weeks old.  The most common reasons boys are circumcised include for cultural/religious beliefs or for parental preference (potentially easier to clean, so baby looks like daddy, etc).  There may be some medical benefits for circumcision:   Circumcised boys seem to have slightly lower rates of: ? Urinary tract infections (per the American Academy of Pediatrics an uncircumcised boy has a 1/100 chance of developing a UTI in the first year of life, a circumcised boy at a 09/998 chance of developing a UTI in the first year of life- a 10% reduction) ? Penis cancer (typically rare- an uncircumcised female has a 1 in 100,000 chance of developing cancer of the penis) ? Sexually transmitted infection (in endemic areas, including HIV, HPV and Herpes- circumcision does NOT protect against gonorrhea, chlamydia, trachomatis, or syphilis) ? Phimosis: a condition where that makes retraction of the foreskin over the glans impossible (0.4 per 1000 boys per year or 0.6% of boys are affected by their 15th birthday)  Boys and men who are not circumcised can reduce these extra risks by: ? Cleaning their penis well ? Using condoms during sex  What are the risks of circumcision?  As with any surgical procedure, there are risks and complications. In circumcision, complications are rare and usually minor, the most common being: ? Bleeding- risk is reduced by holding each clamp for 30 seconds prior to a cut being made, and by holding pressure after the procedure is done ? Infection- the penis is cleaned prior to the procedure, and the procedure is done under sterile technique ? Damage to the urethra or amputation of the penis  How is circumcision done  in baby boys?  The baby will be placed on a special table and the legs restrained for their safety. Numbing medication is injected into the penis, and the skin is cleansed with betadine to decrease the risk of infection.   What to expect:  The penis will look red and raw for 5-7 days as it heals. We expect scabbing around where the cut was made, as well as clear-pink fluid and some swelling of the penis right after the procedure. If your baby's circumcision starts to bleed or develops pus, please contact your pediatrician immediately.  All questions were answered and mother consented.  Tamisha Nordstrom V Tamsin Nader, MD 8:01 AM  

## 2023-01-10 LAB — SURGICAL PATHOLOGY

## 2023-01-15 ENCOUNTER — Inpatient Hospital Stay (HOSPITAL_COMMUNITY): Payer: BC Managed Care – PPO

## 2023-01-17 ENCOUNTER — Inpatient Hospital Stay (HOSPITAL_COMMUNITY)
Admission: AD | Admit: 2023-01-17 | Discharge: 2023-01-18 | Disposition: A | Discharge status: Home / Self Care | Payer: BC Managed Care – PPO | Attending: Obstetrics and Gynecology | Admitting: Obstetrics and Gynecology

## 2023-01-17 ENCOUNTER — Other Ambulatory Visit: Payer: Self-pay | Admitting: Obstetrics and Gynecology

## 2023-01-17 ENCOUNTER — Encounter (HOSPITAL_COMMUNITY): Payer: Self-pay | Admitting: Obstetrics and Gynecology

## 2023-01-17 ENCOUNTER — Ambulatory Visit (INDEPENDENT_AMBULATORY_CARE_PROVIDER_SITE_OTHER): Payer: BC Managed Care – PPO

## 2023-01-17 ENCOUNTER — Other Ambulatory Visit: Payer: Self-pay

## 2023-01-17 ENCOUNTER — Telehealth: Payer: Self-pay | Admitting: Obstetrics and Gynecology

## 2023-01-17 VITALS — BP 125/71 | HR 71 | Ht 64.0 in | Wt 208.4 lb

## 2023-01-17 DIAGNOSIS — Z87891 Personal history of nicotine dependence: Secondary | ICD-10-CM | POA: Insufficient documentation

## 2023-01-17 DIAGNOSIS — R519 Headache, unspecified: Secondary | ICD-10-CM

## 2023-01-17 DIAGNOSIS — Z8249 Family history of ischemic heart disease and other diseases of the circulatory system: Secondary | ICD-10-CM | POA: Diagnosis not present

## 2023-01-17 DIAGNOSIS — O9089 Other complications of the puerperium, not elsewhere classified: Secondary | ICD-10-CM | POA: Diagnosis not present

## 2023-01-17 DIAGNOSIS — O135 Gestational [pregnancy-induced] hypertension without significant proteinuria, complicating the puerperium: Secondary | ICD-10-CM | POA: Insufficient documentation

## 2023-01-17 DIAGNOSIS — Z0130 Encounter for examination of blood pressure without abnormal findings: Secondary | ICD-10-CM

## 2023-01-17 DIAGNOSIS — O165 Unspecified maternal hypertension, complicating the puerperium: Secondary | ICD-10-CM

## 2023-01-17 LAB — COMPREHENSIVE METABOLIC PANEL
ALT: 24 U/L (ref 0–44)
AST: 21 U/L (ref 15–41)
Albumin: 2.9 g/dL — ABNORMAL LOW (ref 3.5–5.0)
Alkaline Phosphatase: 102 U/L (ref 38–126)
Anion gap: 9 (ref 5–15)
BUN: 13 mg/dL (ref 6–20)
CO2: 22 mmol/L (ref 22–32)
Calcium: 8.3 mg/dL — ABNORMAL LOW (ref 8.9–10.3)
Chloride: 108 mmol/L (ref 98–111)
Creatinine, Ser: 0.97 mg/dL (ref 0.44–1.00)
GFR, Estimated: >60 mL/min (ref >60)
Glucose, Bld: 86 mg/dL (ref 70–99)
Potassium: 3.7 mmol/L (ref 3.5–5.1)
Sodium: 139 mmol/L (ref 135–145)
Total Bilirubin: 0.2 mg/dL — ABNORMAL LOW (ref 0.3–1.2)
Total Protein: 6.1 g/dL — ABNORMAL LOW (ref 6.5–8.1)

## 2023-01-17 LAB — CBC
HCT: 33.3 % — ABNORMAL LOW (ref 36.0–46.0)
Hemoglobin: 11.1 g/dL — ABNORMAL LOW (ref 12.0–15.0)
MCH: 29.6 pg (ref 26.0–34.0)
MCHC: 33.3 g/dL (ref 30.0–36.0)
MCV: 88.8 fL (ref 80.0–100.0)
Platelets: 442 10*3/uL — ABNORMAL HIGH (ref 150–400)
RBC: 3.75 MIL/uL — ABNORMAL LOW (ref 3.87–5.11)
RDW: 12.4 % (ref 11.5–15.5)
WBC: 9.9 10*3/uL (ref 4.0–10.5)
nRBC: 0 % (ref 0.0–0.2)

## 2023-01-17 MED ORDER — AMLODIPINE BESYLATE 5 MG PO TABS: 5.0000 mg | ORAL_TABLET | Freq: Every day | ORAL | 0 refills | Status: DC

## 2023-01-17 MED ORDER — ACETAMINOPHEN-CAFFEINE 500-65 MG PO TABS
2.0000 | ORAL_TABLET | Freq: Once | ORAL | Status: AC
  Administered 2023-01-17: 2 via ORAL
  Filled 2023-01-17: qty 2

## 2023-01-17 MED ORDER — CYCLOBENZAPRINE HCL 5 MG PO TABS: 5.0000 mg | ORAL_TABLET | Freq: Three times a day (TID) | ORAL | 0 refills | Status: DC | PRN

## 2023-01-17 NOTE — MAU Provider Note (Signed)
Chief Complaint:  Hypertension   Event Date/Time   First Provider Initiated Contact with Patient 01/17/23 2137       HPI: Jessica Rhodes is a 32 y.o. G1P1001 who presents to maternity admissions reporting having daily headaches, partially relieved by ibuprofen and Tylenol   Took BP and it was elevated at home, so was worried about preeclampsia. . She denies urinary symptoms, dizziness, n/v, or fever/chills.    Had a SVD on4/29/24 and her pregnancy was complicated by GDM and Gestational hypertension  Was started on Nifedipine XL 30mg  and it is making her feel bad. Would like to stop taking it. Finished her Lasix 5 day course.  Hypertension This is a recurrent problem. Associated symptoms include headaches and malaise/fatigue. Pertinent negatives include no blurred vision, palpitations or peripheral edema. There are no associated agents to hypertension.   RN Note: Jessica Rhodes is a 32 y.o. at [redacted]w[redacted]d here in MAU reporting: Persistent HA everyday since delivery. Pt states she had BP issues throughout her pregnancy but she had a follow up today and her BP was normal. Pt states she's been alternating Tylenol and Ibuprofen but it just takes the edge off the HA. Pt states the HA was worse today so she took her BP at home and it was 134/96.  Pt denies new swelling, RUQ pain, or BV. Pt states PP vaginal bleeding is almost gone.   Past Medical History: Past Medical History:  Diagnosis Date   Allergy    Anxiety    Gestational diabetes     Past obstetric history: OB History  Gravida Para Term Preterm AB Living  1 1 1     1   SAB IAB Ectopic Multiple Live Births        0 1    # Outcome Date GA Lbr Len/2nd Weight Sex Delivery Anes PTL Lv  1 Term 01/08/23 [redacted]w[redacted]d 19:24 / 05:02 3060 g M Vag-Vacuum EPI  LIV    Past Surgical History: Past Surgical History:  Procedure Laterality Date   BREAST REDUCTION SURGERY Bilateral 05/05/2020   Procedure: MAMMARY REDUCTION  (BREAST);  Surgeon: Allena Napoleon, MD;  Location: [facility];  Service: Plastics;  Laterality: Bilateral;   TONSILECTOMY/ADENOIDECTOMY WITH MYRINGOTOMY     WISDOM TOOTH EXTRACTION  2016    Family History: Family History  Problem Relation Age of Onset   Cancer Paternal Grandmother    Heart disease Paternal Grandfather    Diabetes Paternal Grandfather     Social History: Social History   Tobacco Use   Smoking status: Former    Types: Cigarettes    Quit date: 09/26/2019    Years since quitting: 3.3   Smokeless tobacco: Never  Vaping Use   Vaping Use: Former   Quit date: 04/11/2022   Substances: Nicotine  Substance Use Topics   Alcohol use: Not Currently    Comment: socially   Drug use: No    Allergies: No Known Allergies  Meds:  Medications Prior to Admission  Medication Sig Dispense Refill Last Dose   acetaminophen (TYLENOL) 325 MG tablet Take 2 tablets (650 mg total) by mouth every 4 (four) hours as needed (for pain scale < 4). 60 tablet 0 01/17/2023 at 1200   escitalopram (LEXAPRO) 20 MG tablet Take 20 mg by mouth daily.   01/16/2023   ibuprofen (ADVIL) 600 MG tablet Take 1 tablet (600 mg total) by mouth every 6 (six) hours. 30 tablet 0 01/17/2023 at 1900   NIFEdipine (ADALAT  CC) 30 MG 24 hr tablet Take 1 tablet (30 mg total) by mouth daily. 30 tablet 11 01/17/2023 at 1100   Prenatal Vit-Fe Fumarate-FA (PRENATAL MULTIVITAMIN) TABS tablet Take 1 tablet by mouth daily at 12 noon. (Patient not taking: Reported on 01/17/2023)       I have reviewed patient's Past Medical Hx, Surgical Hx, Family Hx, Social Hx, medications and allergies.  ROS:  Review of Systems  Constitutional:  Positive for malaise/fatigue.  Eyes:  Negative for blurred vision.  Cardiovascular:  Negative for palpitations.  Neurological:  Positive for headaches.   Other systems negative     Physical Exam  Patient Vitals for the past 24 hrs:  BP Temp Temp src Pulse Resp SpO2 Height Weight  01/17/23 2115 120/81 -- -- 74  -- 98 % -- --  01/17/23 2112 120/81 98.3 F (36.8 C) Oral 82 20 96 % 5\' 4"  (1.626 m) 96.3 kg   Vitals:   01/17/23 2146 01/17/23 2201 01/17/23 2230 01/17/23 2300  BP: 110/81 128/76 114/82 121/83  Pulse: 75 72 68 70  Resp:      Temp:      TempSrc:      SpO2: 98%  99% 99%  Weight:      Height:        Constitutional: Well-developed, well-nourished female in no acute distress.  Cardiovascular: normal rate and rhythm, no ectopy audible, S1 & S2 heard, no murmur Respiratory: normal effort, no distress. Lungs CTAB with no wheezes or crackles GI: Abd soft, non-tender.  Nondistended.  No rebound, No guarding.  Bowel Sounds audible  MS: Extremities nontender, no edema, normal ROM Neurologic: Alert and oriented x 4.   Grossly nonfocal.  DTRs 2+, no clonus GU: Neg CVAT. Skin:  Warm and Dry Psych:  Affect appropriate.   Labs: Results for orders placed or performed during the hospital encounter of 01/17/23 (from the past 24 hour(s))  CBC     Status: Abnormal   Collection Time: 01/17/23 10:28 PM  Result Value Ref Range   WBC 9.9 4.0 - 10.5 K/uL   RBC 3.75 (L) 3.87 - 5.11 MIL/uL   Hemoglobin 11.1 (L) 12.0 - 15.0 g/dL   HCT 45.4 (L) 09.8 - 11.9 %   MCV 88.8 80.0 - 100.0 fL   MCH 29.6 26.0 - 34.0 pg   MCHC 33.3 30.0 - 36.0 g/dL   RDW 14.7 82.9 - 56.2 %   Platelets 442 (H) 150 - 400 K/uL   nRBC 0.0 0.0 - 0.2 %  Comprehensive metabolic panel     Status: Abnormal   Collection Time: 01/17/23 10:28 PM  Result Value Ref Range   Sodium 139 135 - 145 mmol/L   Potassium 3.7 3.5 - 5.1 mmol/L   Chloride 108 98 - 111 mmol/L   CO2 22 22 - 32 mmol/L   Glucose, Bld 86 70 - 99 mg/dL   BUN 13 6 - 20 mg/dL   Creatinine, Ser 1.30 0.44 - 1.00 mg/dL   Calcium 8.3 (L) 8.9 - 10.3 mg/dL   Total Protein 6.1 (L) 6.5 - 8.1 g/dL   Albumin 2.9 (L) 3.5 - 5.0 g/dL   AST 21 15 - 41 U/L   ALT 24 0 - 44 U/L   Alkaline Phosphatase 102 38 - 126 U/L   Total Bilirubin 0.2 (L) 0.3 - 1.2 mg/dL   GFR, Estimated >86  >57 mL/min   Anion gap 9 5 - 15    --/--/A POS (04/28 0050)  Imaging:  No results found.  MAU Course/MDM: I have reviewed the triage vital signs and the nursing notes.   Pertinent labs & imaging results that were available during my care of the patient were reviewed by me and considered in my medical decision making (see chart for details).      I have reviewed her medical records including past results, notes and treatments.   I have ordered labs as follows:  CBC and CMET are normal  Imaging ordered: none Results reviewed.    Treatments in MAU included Excedrin Tension.  This did improve her headache. Lab results reviewed and are normal  Discussed we can change her to Norvasc for Hypertension to see if she tolerates it better  Pt stable at time of discharge.  Assessment: Postpartum Day #9 Postpartum hypertension with intolerance of Nifedipine Headache, improved with medication  Plan: Discharge home Recommend Monitor BPs Rx sent for Flexeril  for headache prn May use excedrin Tension if desires Rx sent for Norvasc 5mg  to substitute Nifedipine XL  Encouraged to return here or to other Urgent Care/ED if she develops worsening of symptoms, increase in pain, fever, or other concerning symptoms.   Wynelle Bourgeois CNM, MSN Certified Nurse-Midwife 01/17/2023 9:37 PM

## 2023-01-17 NOTE — Progress Notes (Signed)
Blood Pressure Check Visit  Jessica Rhodes is here for blood pressure check following vacuum extraction on 01/08/23. BP today is 125/71. Patient endorses headache and peripheral edema (specifically in hands); patient denies changes in vision, SOB, dizziness. Reviewed with Dr. Alysia Penna.  Patient is currently on Nifedipine 30mg  QD orally.   Per provider, patient is to continue taking Nifedipine as prescribed and we will re-evaluate at [facility] visit in June. For hand swelling, provider recommended hand/wrist splints.   Information relayed to patient; patient verbalized understanding and had no questions or concerns.    Meryl Crutch, RN 01/17/2023  9:05 AM

## 2023-01-17 NOTE — MAU Note (Signed)
Jessica Rhodes is a 32 y.o. at [redacted]w[redacted]d here in MAU reporting: Persistent HA everyday since delivery. Pt states she had BP issues throughout her pregnancy but she had a follow up today and her BP was normal. Pt states she's been alternating Tylenol and Ibuprofen but it just takes the edge off the HA. Pt states the HA was worse today so she took her BP at home and it was 134/96.  Pt denies new swelling, RUQ pain, or BV. Pt states PP vaginal bleeding is almost gone.   Onset of complaint: 01/09/2023 Pain score: 5/10 HA Vitals:   01/17/23 2112  BP: 120/81  Pulse: 82  Resp: 20  Temp: 98.3 F (36.8 C)  SpO2: 96%     Lab orders placed from triage:

## 2023-01-17 NOTE — Telephone Encounter (Signed)
Received phone call from [facility] reporting an elevated BP 134/92 with a persistent headache on Nifedipine  Patient reports headache all day not relieved by tylenol and ibuprofen. Patient advised to present to MAU for further evaluation

## 2023-01-18 DIAGNOSIS — R519 Headache, unspecified: Secondary | ICD-10-CM | POA: Diagnosis not present

## 2023-01-19 ENCOUNTER — Telehealth (HOSPITAL_COMMUNITY): Payer: Self-pay | Admitting: *Deleted

## 2023-01-19 NOTE — Telephone Encounter (Signed)
Left phone voicemail message.  Duffy Rhody, RN 01-19-2023 at 11:20am

## 2023-01-22 ENCOUNTER — Encounter: Payer: Self-pay | Admitting: Certified Nurse Midwife

## 2023-01-22 DIAGNOSIS — N390 Urinary tract infection, site not specified: Secondary | ICD-10-CM

## 2023-01-23 MED ORDER — CEFADROXIL 500 MG PO CAPS
500.0000 mg | ORAL_CAPSULE | Freq: Two times a day (BID) | ORAL | 0 refills | Status: AC
Start: 2023-01-23 — End: 2023-01-30

## 2023-01-24 ENCOUNTER — Telehealth: Payer: Self-pay

## 2023-01-24 NOTE — Telephone Encounter (Signed)
Called Pt regarding [facility] BP Alert of 118/90 on 01/23/23, no answer, leftVM for call back.

## 2023-01-30 ENCOUNTER — Other Ambulatory Visit: Payer: Self-pay

## 2023-01-30 ENCOUNTER — Emergency Department (HOSPITAL_COMMUNITY)
Admission: EM | Admit: 2023-01-30 | Discharge: 2023-01-31 | Disposition: A | Discharge status: Home / Self Care | Payer: BC Managed Care – PPO | Attending: Emergency Medicine | Admitting: Emergency Medicine

## 2023-01-30 DIAGNOSIS — R1030 Lower abdominal pain, unspecified: Secondary | ICD-10-CM | POA: Diagnosis not present

## 2023-01-30 DIAGNOSIS — R509 Fever, unspecified: Secondary | ICD-10-CM | POA: Insufficient documentation

## 2023-01-30 DIAGNOSIS — N3001 Acute cystitis with hematuria: Secondary | ICD-10-CM | POA: Insufficient documentation

## 2023-01-30 DIAGNOSIS — R109 Unspecified abdominal pain: Secondary | ICD-10-CM | POA: Diagnosis not present

## 2023-01-30 NOTE — ED Triage Notes (Signed)
Patient reports fever with chills and dysuria this evening , completed antibiotic for UTI today , patient stated taking OTC Tylenol and Ibuprofen today .

## 2023-01-31 ENCOUNTER — Emergency Department (HOSPITAL_COMMUNITY): Payer: BC Managed Care – PPO

## 2023-01-31 DIAGNOSIS — R109 Unspecified abdominal pain: Secondary | ICD-10-CM | POA: Diagnosis not present

## 2023-01-31 LAB — CBC WITH DIFFERENTIAL/PLATELET
Abs Immature Granulocytes: 0.03 10*3/uL (ref 0.00–0.07)
Basophils Absolute: 0.1 10*3/uL (ref 0.0–0.1)
Basophils Relative: 1 %
Eosinophils Absolute: 0.1 10*3/uL (ref 0.0–0.5)
Eosinophils Relative: 1 %
HCT: 39.9 % (ref 36.0–46.0)
Hemoglobin: 13.1 g/dL (ref 12.0–15.0)
Immature Granulocytes: 0 %
Lymphocytes Relative: 16 %
Lymphs Abs: 1.4 10*3/uL (ref 0.7–4.0)
MCH: 29 pg (ref 26.0–34.0)
MCHC: 32.8 g/dL (ref 30.0–36.0)
MCV: 88.3 fL (ref 80.0–100.0)
Monocytes Absolute: 0.4 10*3/uL (ref 0.1–1.0)
Monocytes Relative: 4 %
Neutro Abs: 6.9 10*3/uL (ref 1.7–7.7)
Neutrophils Relative %: 78 %
Platelets: 287 10*3/uL (ref 150–400)
RBC: 4.52 MIL/uL (ref 3.87–5.11)
RDW: 11.9 % (ref 11.5–15.5)
WBC: 8.8 10*3/uL (ref 4.0–10.5)
nRBC: 0 % (ref 0.0–0.2)

## 2023-01-31 LAB — URINALYSIS, ROUTINE W REFLEX MICROSCOPIC
Bilirubin Urine: NEGATIVE
Glucose, UA: NEGATIVE mg/dL
Ketones, ur: NEGATIVE mg/dL
Nitrite: NEGATIVE
Protein, ur: NEGATIVE mg/dL
Specific Gravity, Urine: 1.019 (ref 1.005–1.030)
pH: 5 (ref 5.0–8.0)

## 2023-01-31 LAB — COMPREHENSIVE METABOLIC PANEL
ALT: 20 U/L (ref 0–44)
AST: 21 U/L (ref 15–41)
Albumin: 3.9 g/dL (ref 3.5–5.0)
Alkaline Phosphatase: 82 U/L (ref 38–126)
Anion gap: 10 (ref 5–15)
BUN: 10 mg/dL (ref 6–20)
CO2: 23 mmol/L (ref 22–32)
Calcium: 8.8 mg/dL — ABNORMAL LOW (ref 8.9–10.3)
Chloride: 104 mmol/L (ref 98–111)
Creatinine, Ser: 1.24 mg/dL — ABNORMAL HIGH (ref 0.44–1.00)
GFR, Estimated: 59 mL/min — ABNORMAL LOW (ref >60)
Glucose, Bld: 91 mg/dL (ref 70–99)
Potassium: 4.1 mmol/L (ref 3.5–5.1)
Sodium: 137 mmol/L (ref 135–145)
Total Bilirubin: 0.3 mg/dL (ref 0.3–1.2)
Total Protein: 6.8 g/dL (ref 6.5–8.1)

## 2023-01-31 LAB — I-STAT BETA HCG BLOOD, ED (MC, WL, AP ONLY): I-stat hCG, quantitative: 9.3 m[IU]/mL — ABNORMAL HIGH (ref <5)

## 2023-01-31 LAB — LIPASE, BLOOD: Lipase: 34 U/L (ref 11–51)

## 2023-01-31 LAB — HCG, QUANTITATIVE, PREGNANCY: hCG, Beta Chain, Quant, S: 3 m[IU]/mL (ref <5)

## 2023-01-31 MED ORDER — IOHEXOL 350 MG/ML SOLN
75.0000 mL | Freq: Once | INTRAVENOUS | Status: AC | PRN
  Administered 2023-01-31: 75 mL via INTRAVENOUS

## 2023-01-31 MED ORDER — CEFDINIR 300 MG PO CAPS: 300.0000 mg | ORAL_CAPSULE | Freq: Two times a day (BID) | ORAL | 0 refills | Status: AC

## 2023-01-31 MED ORDER — ACETAMINOPHEN 500 MG PO TABS
1000.0000 mg | ORAL_TABLET | Freq: Once | ORAL | Status: AC
  Administered 2023-01-31: 1000 mg via ORAL
  Filled 2023-01-31: qty 2

## 2023-01-31 NOTE — Discharge Instructions (Addendum)
It was a pleasure taking care of you today!  Your labs were unremarkable tday. Your urine today showed concern for continued UTI.  You will be sent a prescription for cefdinir, take as directed.  Your urine will be sent for culture. Your CT showed concern for punctate kidney stones. It also showed bicornuate uterus, this can be managed with your OBGYN. You may continue to take tylenol and ibuprofen at home for your symptoms. Maintain your scheduled follow up appointment with your OBGYN as scheduled. Return to the ED for increasing/worsening symptoms.

## 2023-01-31 NOTE — ED Provider Notes (Signed)
[place] EMERGENCY DEPARTMENT AT [facility] Provider Note   CSN: [id number] Arrival date & time: 01/30/23  2336     History  Chief Complaint  Patient presents with   Fever / Dysuria     Jessica Rhodes is a 32 y.o. female who presents to the ED with concerns for fever onset tonight. She notes that she has been taking Duricef that was prescribed to her on 01/22/2023.  Has associated lower abdominal pain, bilateral flank pain, dysuria (at the site of the urethra), nausea.  No meds tried at home.  Denies past medical history of kidney stones.  Denies vomiting, hematuria.  The history is provided by the patient. No language interpreter was used.       Home Medications Prior to Admission medications   Medication Sig Start Date End Date Taking? Authorizing Provider  cefdinir (OMNICEF) 300 MG capsule Take 1 capsule (300 mg total) by mouth 2 (two) times daily for 7 days. 01/31/23 02/07/23 Yes Nikyla Navedo A, PA-C  acetaminophen (TYLENOL) 325 MG tablet Take 2 tablets (650 mg total) by mouth every 4 (four) hours as needed (for pain scale < 4). 01/09/23   Cresenzo, Cyndi Lennert, MD  amLODipine (NORVASC) 5 MG tablet Take 1 tablet (5 mg total) by mouth daily. 01/17/23   Aviva Signs, CNM  cyclobenzaprine (FLEXERIL) 5 MG tablet Take 1 tablet (5 mg total) by mouth 3 (three) times daily as needed for muscle spasms. 01/17/23   Aviva Signs, CNM  escitalopram (LEXAPRO) 20 MG tablet Take 20 mg by mouth daily. 05/08/22   [provider]  ibuprofen (ADVIL) 600 MG tablet Take 1 tablet (600 mg total) by mouth every 6 (six) hours. 01/09/23   Celedonio Savage, MD  Prenatal Vit-Fe Fumarate-FA (PRENATAL MULTIVITAMIN) TABS tablet Take 1 tablet by mouth daily at 12 noon. Patient not taking: Reported on 01/17/2023    [provider]      Allergies    Patient has no known allergies.    Review of Systems   Review of Systems  All other systems reviewed and are  negative.   Physical Exam Updated Vital Signs BP 126/88 (BP Location: Right Arm)   Pulse 99   Temp 98.7 F (37.1 C)   Resp 18   LMP 04/16/2022 (Exact Date)   SpO2 96%  Physical Exam Vitals and nursing note reviewed.  Constitutional:      General: She is not in acute distress.    Appearance: She is not diaphoretic.  HENT:     Head: Normocephalic and atraumatic.     Mouth/Throat:     Pharynx: No oropharyngeal exudate.  Eyes:     General: No scleral icterus.    Conjunctiva/sclera: Conjunctivae normal.  Cardiovascular:     Rate and Rhythm: Normal rate and regular rhythm.     Pulses: Normal pulses.     Heart sounds: Normal heart sounds.  Pulmonary:     Effort: Pulmonary effort is normal. No respiratory distress.     Breath sounds: Normal breath sounds. No wheezing.  Abdominal:     General: Bowel sounds are normal.     Palpations: Abdomen is soft. There is no mass.     Tenderness: There is no abdominal tenderness. There is no right CVA tenderness, left CVA tenderness, guarding or rebound.     Comments: No abdominal tenderness to palpation.  No CVA tenderness to palpation.  Musculoskeletal:        General: Normal range  of motion.     Cervical back: Normal range of motion and neck supple.  Skin:    General: Skin is warm and dry.  Neurological:     Mental Status: She is alert.  Psychiatric:        Behavior: Behavior normal.     ED Results / Procedures / Treatments   Labs (all labs ordered are listed, but only abnormal results are displayed) Labs Reviewed  URINALYSIS, ROUTINE W REFLEX MICROSCOPIC - Abnormal; Notable for the following components:      Result Value   APPearance HAZY (*)    Hgb urine dipstick MODERATE (*)    Leukocytes,Ua LARGE (*)    Bacteria, UA FEW (*)    All other components within normal limits  COMPREHENSIVE METABOLIC PANEL - Abnormal; Notable for the following components:   Creatinine, Ser 1.24 (*)    Calcium 8.8 (*)    GFR, Estimated 59 (*)     All other components within normal limits  I-STAT BETA HCG BLOOD, ED ([facility], [facility], [facility] ONLY) - Abnormal; Notable for the following components:   I-stat hCG, quantitative 9.3 (*)    All other components within normal limits  URINE CULTURE  CBC WITH DIFFERENTIAL/PLATELET  LIPASE, BLOOD  HCG, QUANTITATIVE, PREGNANCY    EKG None  Radiology CT ABDOMEN PELVIS W CONTRAST  Result Date: 01/31/2023 CLINICAL DATA:  Abdominal pain. EXAM: CT ABDOMEN AND PELVIS WITH CONTRAST TECHNIQUE: Multidetector CT imaging of the abdomen and pelvis was performed using the standard protocol following bolus administration of intravenous contrast. RADIATION DOSE REDUCTION: This exam was performed according to the departmental dose-optimization program which includes automated exposure control, adjustment of the mA and/or kV according to patient size and/or use of iterative reconstruction technique. CONTRAST:  75mL OMNIPAQUE IOHEXOL 350 MG/ML SOLN COMPARISON:  CT of the abdomen pelvis dated 10/22/2021. FINDINGS: Lower chest: The visualized lung bases are clear. No intra-abdominal free air or free fluid. Hepatobiliary: Probable fatty liver. No biliary dilatation. The gallbladder is unremarkable. Pancreas: Unremarkable. No pancreatic ductal dilatation or surrounding inflammatory changes. Spleen: Normal in size without focal abnormality. Adrenals/Urinary Tract: The adrenal glands are unremarkable. Several small nonobstructing bilateral lower pole renal calculi measure up to 4 mm in the lower pole of the right kidney. There is no hydronephrosis on either side. The visualized ureters and urinary bladder appear unremarkable. Stomach/Bowel: There is no bowel obstruction or active inflammation. The appendix is normal. Vascular/Lymphatic: The abdominal aorta and IVC are unremarkable. No portal venous gas. There is no adenopathy. Reproductive: There is anteverted. 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 This can be better  evaluated with MRI. No adnexal masses. Other: None Musculoskeletal: No acute or significant osseous findings. IMPRESSION: 1. No acute intra-abdominal or pelvic pathology. 2. Small nonobstructing bilateral renal calculi. No hydronephrosis. 3. Probable fatty liver. 4. Septate or bicornuate uterine morphology. This can be better evaluated with MRI. Electronically Signed   By: Elgie Collard M.D.   On: 01/31/2023 03:54    Procedures Procedures    Medications Ordered in ED Medications  acetaminophen (TYLENOL) tablet 1,000 mg (1,000 mg Oral Given 01/31/23 0226)  iohexol (OMNIPAQUE) 350 MG/ML injection 75 mL (75 mLs Intravenous Contrast Given 01/31/23 0339)    ED Course/ Medical Decision Making/ A&P Clinical Course as of 01/31/23 0438  Wed Jan 31, 2023  0356 Discussed with patient lab findings.  [SB]  0415 Re-evaluated and noted improvement of symptoms with treatment regimen.  Patient also notes concern for sore throat.  Evaluated patient's oropharynx noted to be erythematous without appreciable exudate noted.  Uvula midline without swelling.  Able to speak in clear complete sentences.  Offered to obtain strep swab today and have patient discharged home and follow-up with results on her MyChart.  Patient declined strep swab at this time and notes that she will follow-up as needed.  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  Discussed discharge treatment plan. Pt agreeable at this time. Pt appears safe for discharge. [SB]    Clinical Course User Index [SB] Cassey Bacigalupo A, PA-C                             Medical Decision Making Amount and/or Complexity of Data Reviewed Labs: ordered. Radiology: ordered.  Risk OTC drugs. Prescription drug management.    Patient presents to the emergency department with concerns for fever onset tonight. Was treated with duricef for an UTI. Took a at home UTI test that was positive. No urine test  completed in the office. Recent urine culture from 2023 showed mixed flora. Pt with lower abdominal pain and bilateral flank pain.  Patient afebrile.  On exam patient without acute cardiovascular, respiratory, down exam findings.  No CVA tenderness to palpation noted bilaterally.  Differential diagnosis includes appendicitis, diverticulitis, acute cystitis, pyelonephritis, nephrolithiasis.   Additional history obtained:  External records from outside source obtained and reviewed including: Patient called her OB/GYN office on 01/22/2023 and noted her positive UTI test at home.  Patient was started on Duricef.  Labs:  I ordered, and personally interpreted labs.  The pertinent results include:  CBC unremarkable Lipase unremarkable hCG quantitative unremarkable CMP slightly elevated creatinine at 1.24 otherwise unremarkable Urinalysis notable for moderate amount of hemoglobin, large leuks. Urine culture ordered with results pending at time of discharge  Imaging: I ordered imaging studies including CT abdomen pelvis I independently visualized and interpreted imaging which showed  1. No acute intra-abdominal or pelvic pathology.  2. Small nonobstructing bilateral renal calculi. No hydronephrosis.  3. Probable fatty liver.  4. Septate or bicornuate uterine morphology. This can be better  evaluated with MRI.   I agree with the radiologist interpretation  Medications:  I ordered medication including Tylenol for fever management.  Reevaluation of the patient after these medicines and interventions, I reevaluated the patient and found that they have improved I have reviewed the patients home medicines and have made adjustments as needed   Disposition: Presenting suspicious for fever as well as acute cystitis.  Doubt concerns at this time for pyelonephritis.  Bilateral punctate nephrolithiasis noted that are nonobstructed, no hydronephrosis noted.  Doubt concerns this time for appendicitis or  diverticulitis.  After consideration of the diagnostic results and the patients response to treatment, I feel that the patient would benefit from Discharge home.  Patient discharged home with a prescription for cefdinir.  Instructed to follow-up with her primary care provider as well as OB/GYN specialist regarding today's ED visit.  Supportive care measures and strict return precautions discussed with patient at bedside. Pt acknowledges and verbalizes understanding. Pt appears safe for discharge. Follow up as indicated in discharge paperwork.   This chart was dictated using voice recognition software, Dragon. Despite the best efforts of this provider to proofread and correct errors, errors may still occur which can change documentation meaning.   Final Clinical Impression(s) / ED Diagnoses Final diagnoses:  Fever,  unspecified fever cause  Acute cystitis with hematuria    Rx / DC Orders ED Discharge Orders          Ordered    cefdinir (OMNICEF) 300 MG capsule  2 times daily        01/31/23 0424              Samoria Fedorko A, PA-C 01/31/23 0439    Palumbo, April, MD 01/31/23 1610

## 2023-02-01 LAB — URINE CULTURE

## 2023-02-02 LAB — URINE CULTURE

## 2023-02-03 ENCOUNTER — Telehealth (HOSPITAL_BASED_OUTPATIENT_CLINIC_OR_DEPARTMENT_OTHER): Payer: Self-pay | Admitting: *Deleted

## 2023-02-03 NOTE — Telephone Encounter (Signed)
Post ED Visit - Positive Culture Follow-up  Culture report reviewed by antimicrobial stewardship pharmacist: [facility] []  Enzo Bi, Pharm.D. []  Celedonio Miyamoto, Pharm.D., BCPS AQ-ID []  Garvin Fila, Pharm.D., BCPS []  Georgina Pillion, [address].D., BCPS []  [place], [address].D., BCPS, AAHIVP []  Estella Husk, Pharm.D., BCPS, AAHIVP []  Lysle Pearl, PharmD, BCPS []  Phillips Climes, PharmD, BCPS []  Agapito Games, PharmD, BCPS []  Verlan Friends, PharmD []  Mervyn Gay, PharmD, BCPS [x]  Delmar Landau, PharmD  [facility] []  Len Childs, PharmD []  Greer Pickerel, PharmD []  Adalberto Cole, PharmD []  Perlie Gold, Rph []  Lonell Face) Jean Rosenthal, PharmD []  Earl Many, PharmD []  Junita Push, PharmD []  Dorna Leitz, PharmD []  Terrilee Files, PharmD []  Lynann Beaver, PharmD []  Keturah Barre, PharmD []  Loralee Pacas, PharmD []  Bernadene Person, PharmD   Positive urine culture Treated with Cefdinir,  and no further patient follow-up is required at this time.  Patsey Berthold 02/03/2023, 1:01 PM

## 2023-02-14 ENCOUNTER — Other Ambulatory Visit: Payer: Self-pay | Admitting: Advanced Practice Midwife

## 2023-02-21 ENCOUNTER — Other Ambulatory Visit: Payer: Self-pay

## 2023-02-21 ENCOUNTER — Encounter: Payer: Self-pay | Admitting: Certified Nurse Midwife

## 2023-02-21 ENCOUNTER — Other Ambulatory Visit (HOSPITAL_COMMUNITY)
Admission: RE | Admit: 2023-02-21 | Discharge: 2023-02-21 | Disposition: A | Discharge status: Home / Self Care | Payer: BC Managed Care – PPO | Source: Ambulatory Visit | Attending: Certified Nurse Midwife | Admitting: Certified Nurse Midwife

## 2023-02-21 ENCOUNTER — Ambulatory Visit (INDEPENDENT_AMBULATORY_CARE_PROVIDER_SITE_OTHER): Payer: BC Managed Care – PPO | Admitting: Certified Nurse Midwife

## 2023-02-21 VITALS — BP 123/84 | HR 77 | Wt 209.3 lb

## 2023-02-21 DIAGNOSIS — Z8632 Personal history of gestational diabetes: Secondary | ICD-10-CM | POA: Diagnosis not present

## 2023-02-21 DIAGNOSIS — R309 Painful micturition, unspecified: Secondary | ICD-10-CM | POA: Insufficient documentation

## 2023-02-21 DIAGNOSIS — N760 Acute vaginitis: Secondary | ICD-10-CM

## 2023-02-21 DIAGNOSIS — B9689 Other specified bacterial agents as the cause of diseases classified elsewhere: Secondary | ICD-10-CM

## 2023-02-21 DIAGNOSIS — O165 Unspecified maternal hypertension, complicating the puerperium: Secondary | ICD-10-CM

## 2023-02-21 NOTE — Progress Notes (Signed)
Post Partum Visit Note  Jessica Rhodes is a 32 y.o. G64P1001 female who presents for a postpartum visit. She is 6 weeks 2 days postpartum following a vacuum-assisted vaginal delivery.  I have fully reviewed the prenatal and intrapartum course. The delivery was at [redacted]w[redacted]d.  Anesthesia: local and epidural. Postpartum course has been uncomplicated. Baby is doing well. Baby is feeding by both breast and bottle - Similac Total Care 360 . Bleeding staining only. Bowel function is normal. Bladder function is abnormal: discomfort with urination . Patient is not sexually active. Contraception method is  Fertility Awareness . Postpartum depression screening: negative.  The pregnancy intention screening data noted above was reviewed. Potential methods of contraception were discussed. The patient elected to proceed with FAM or LAM; Female Condom.   Edinburgh Postnatal Depression Scale - 02/21/23 1804       Edinburgh Postnatal Depression Scale:  In the Past 7 Days   I have been able to laugh and see the funny side of things. 0    I have looked forward with enjoyment to things. 0    I have blamed myself unnecessarily when things went wrong. 1    I have been anxious or worried for no good reason. 1    I have felt scared or panicky for no good reason. 1    Things have been getting on top of me. 0    I have been so unhappy that I have had difficulty sleeping. 0    I have felt sad or miserable. 0    I have been so unhappy that I have been crying. 0    The thought of harming myself has occurred to me. 0    Edinburgh Postnatal Depression Scale Total 3             Health Maintenance Due  Topic Date Due   COVID-19 Vaccine (1) Never done    The following portions of the patient's history were reviewed and updated as appropriate: allergies, current medications, past family history, past medical history, past social history, past surgical history, and problem list.  Review of Systems Pertinent items  noted in HPI and remainder of comprehensive ROS otherwise negative.  Objective:  BP 123/84   Pulse 77   Wt 209 lb 4.8 oz (94.9 kg)   LMP 04/16/2022 (Exact Date)   Breastfeeding Yes   BMI 35.93 kg/m    Constitutional: Alert, oriented female in no physical distress.  HEENT: PERRLA Skin: normal color and turgor, no rash Cardiovascular: normal rate & rhythm Respiratory: normal effort, no problems with respiration noted GI: Abd soft, non-tender MS: Extremities nontender, no edema, normal ROM Neurologic: Alert and oriented x 4.  GU: no CVA tenderness Pelvic: not indicated Breasts: normal lactating breasts  Assessment:  1. Postpartum care and examination - recovering well  2. Postpartum hypertension - BP normal today off nifedipine  3. History of gestational diabetes - Will return for 55mo A1C  Plan:   Essential components of care per ACOG recommendations:  1.  Mood and well being: Patient with negative depression screening today. Reviewed local resources for support.  - Patient tobacco use? No.   - hx of drug use? No.    2. Infant care and feeding:  -Patient currently breastmilk feeding? Yes. Reviewed importance of draining breast regularly to support lactation.  -Social determinants of health (SDOH) reviewed in EPIC. No concerns  3. Sexuality, contraception and birth spacing - Patient does not want a pregnancy in  the next year.  Desired family size is 2 children.  - Reviewed reproductive life planning. Reviewed contraceptive methods based on pt preferences and effectiveness.  Patient desired FAM or LAM and Female Condom today.   - Discussed birth spacing of 18 months  4. Sleep and fatigue -Encouraged family/partner/community support of 4 hrs of uninterrupted sleep to help with mood and fatigue  5. Physical Recovery  - Discussed patients delivery and complications. She describes her labor as good. - Patient had a Vaginal, no problems at delivery. Patient had a 2nd degree  laceration. Perineal healing reviewed. Patient expressed understanding - Patient has urinary incontinence? No. - Patient is safe to resume physical and sexual activity  6.  Health Maintenance - HM due items addressed Yes - Last pap smear 05/30/22 at [facility]: NILM, negative HPV. Pap smear not done at today's visit.  -Breast Cancer screening indicated? No.   7. Chronic Disease/Pregnancy Condition follow up: Hypertension and Gestational Diabetes - PCP follow up as needed - BP stable, will return for 26mo A1C  Bernerd Limbo, CNM [facility] [employer], [facility]

## 2023-02-22 LAB — CERVICOVAGINAL ANCILLARY ONLY
Bacterial Vaginitis (gardnerella): POSITIVE — AB
Candida Glabrata: NEGATIVE
Candida Vaginitis: NEGATIVE
Comment: NEGATIVE
Comment: NEGATIVE
Comment: NEGATIVE

## 2023-02-23 LAB — URINE CULTURE

## 2023-02-23 MED ORDER — METRONIDAZOLE 0.75 % VA GEL
1.0000 | Freq: Every day | VAGINAL | 0 refills | Status: AC
Start: 2023-02-23 — End: 2023-03-02

## 2023-04-13 ENCOUNTER — Other Ambulatory Visit: Payer: Self-pay

## 2023-04-13 ENCOUNTER — Emergency Department: Payer: BC Managed Care – PPO

## 2023-04-13 ENCOUNTER — Observation Stay
Admission: EM | Admit: 2023-04-13 | Discharge: 2023-04-14 | Disposition: A | Discharge status: Home / Self Care | Payer: BC Managed Care – PPO | Attending: Surgery | Admitting: Surgery

## 2023-04-13 DIAGNOSIS — K3580 Unspecified acute appendicitis: Secondary | ICD-10-CM | POA: Diagnosis not present

## 2023-04-13 DIAGNOSIS — R1031 Right lower quadrant pain: Secondary | ICD-10-CM | POA: Diagnosis not present

## 2023-04-13 DIAGNOSIS — Z87891 Personal history of nicotine dependence: Secondary | ICD-10-CM | POA: Diagnosis not present

## 2023-04-13 DIAGNOSIS — K449 Diaphragmatic hernia without obstruction or gangrene: Secondary | ICD-10-CM | POA: Diagnosis not present

## 2023-04-13 DIAGNOSIS — K37 Unspecified appendicitis: Secondary | ICD-10-CM | POA: Diagnosis not present

## 2023-04-13 DIAGNOSIS — R109 Unspecified abdominal pain: Secondary | ICD-10-CM | POA: Diagnosis not present

## 2023-04-13 DIAGNOSIS — I10 Essential (primary) hypertension: Secondary | ICD-10-CM | POA: Diagnosis not present

## 2023-04-13 LAB — CBC WITH DIFFERENTIAL/PLATELET
Abs Immature Granulocytes: 0.07 10*3/uL (ref 0.00–0.07)
Basophils Absolute: 0.1 10*3/uL (ref 0.0–0.1)
Basophils Relative: 0 %
Eosinophils Absolute: 0.1 10*3/uL (ref 0.0–0.5)
Eosinophils Relative: 1 %
HCT: 43.1 % (ref 36.0–46.0)
Hemoglobin: 14.1 g/dL (ref 12.0–15.0)
Immature Granulocytes: 0 %
Lymphocytes Relative: 11 %
Lymphs Abs: 2 10*3/uL (ref 0.7–4.0)
MCH: 26.7 pg (ref 26.0–34.0)
MCHC: 32.7 g/dL (ref 30.0–36.0)
MCV: 81.5 fL (ref 80.0–100.0)
Monocytes Absolute: 0.7 10*3/uL (ref 0.1–1.0)
Monocytes Relative: 4 %
Neutro Abs: 16.3 10*3/uL — ABNORMAL HIGH (ref 1.7–7.7)
Neutrophils Relative %: 84 %
Platelets: 387 10*3/uL (ref 150–400)
RBC: 5.29 MIL/uL — ABNORMAL HIGH (ref 3.87–5.11)
RDW: 12.5 % (ref 11.5–15.5)
WBC: 19.2 10*3/uL — ABNORMAL HIGH (ref 4.0–10.5)
nRBC: 0 % (ref 0.0–0.2)

## 2023-04-13 LAB — URINALYSIS, ROUTINE W REFLEX MICROSCOPIC
Bacteria, UA: NONE SEEN
Bilirubin Urine: NEGATIVE
Glucose, UA: NEGATIVE mg/dL
Ketones, ur: 5 mg/dL — AB
Nitrite: NEGATIVE
Protein, ur: 30 mg/dL — AB
Specific Gravity, Urine: 1.021 (ref 1.005–1.030)
pH: 8 (ref 5.0–8.0)

## 2023-04-13 LAB — COMPREHENSIVE METABOLIC PANEL
ALT: 23 U/L (ref 0–44)
AST: 27 U/L (ref 15–41)
Albumin: 4.9 g/dL (ref 3.5–5.0)
Alkaline Phosphatase: 78 U/L (ref 38–126)
Anion gap: 14 (ref 5–15)
BUN: 9 mg/dL (ref 6–20)
CO2: 20 mmol/L — ABNORMAL LOW (ref 22–32)
Calcium: 9.6 mg/dL (ref 8.9–10.3)
Chloride: 103 mmol/L (ref 98–111)
Creatinine, Ser: 1.08 mg/dL — ABNORMAL HIGH (ref 0.44–1.00)
GFR, Estimated: >60 mL/min (ref >60)
Glucose, Bld: 127 mg/dL — ABNORMAL HIGH (ref 70–99)
Potassium: 3.5 mmol/L (ref 3.5–5.1)
Sodium: 137 mmol/L (ref 135–145)
Total Bilirubin: 0.5 mg/dL (ref 0.3–1.2)
Total Protein: 8.3 g/dL — ABNORMAL HIGH (ref 6.5–8.1)

## 2023-04-13 LAB — LIPASE, BLOOD: Lipase: 35 U/L (ref 11–51)

## 2023-04-13 LAB — POC URINE PREG, ED: Preg Test, Ur: NEGATIVE

## 2023-04-13 MED ORDER — ACETAMINOPHEN 500 MG PO TABS
1000.0000 mg | ORAL_TABLET | Freq: Four times a day (QID) | ORAL | Status: DC
  Administered 2023-04-13 – 2023-04-14 (×2): 1000 mg via ORAL
  Filled 2023-04-13 (×2): qty 2

## 2023-04-13 MED ORDER — DIPHENHYDRAMINE HCL 12.5 MG/5ML PO ELIX: 12.5000 mg | ORAL_SOLUTION | Freq: Four times a day (QID) | ORAL | Status: DC | PRN

## 2023-04-13 MED ORDER — ONDANSETRON 4 MG PO TBDP: 4.0000 mg | ORAL_TABLET | Freq: Four times a day (QID) | ORAL | Status: DC | PRN

## 2023-04-13 MED ORDER — SODIUM CHLORIDE 0.9 % IV SOLN
2.0000 g | INTRAVENOUS | Status: DC
  Administered 2023-04-14: 2 g via INTRAVENOUS
  Filled 2023-04-13: qty 20

## 2023-04-13 MED ORDER — SODIUM CHLORIDE 0.9 % IV SOLN: INTRAVENOUS | Status: DC

## 2023-04-13 MED ORDER — ONDANSETRON HCL 4 MG/2ML IJ SOLN: 4.0000 mg | Freq: Four times a day (QID) | INTRAMUSCULAR | Status: DC | PRN

## 2023-04-13 MED ORDER — SODIUM CHLORIDE 0.9 % IV BOLUS
1000.0000 mL | Freq: Once | INTRAVENOUS | Status: AC
  Administered 2023-04-13: 1000 mL via INTRAVENOUS

## 2023-04-13 MED ORDER — MUPIROCIN 2 % EX OINT
1.0000 | TOPICAL_OINTMENT | Freq: Two times a day (BID) | CUTANEOUS | Status: DC
  Filled 2023-04-13: qty 22

## 2023-04-13 MED ORDER — KETOROLAC TROMETHAMINE 30 MG/ML IJ SOLN: 30.0000 mg | Freq: Four times a day (QID) | INTRAMUSCULAR | Status: DC | PRN

## 2023-04-13 MED ORDER — HALOPERIDOL LACTATE 5 MG/ML IJ SOLN
5.0000 mg | Freq: Once | INTRAMUSCULAR | Status: AC
  Administered 2023-04-13: 5 mg via INTRAVENOUS
  Filled 2023-04-13: qty 1

## 2023-04-13 MED ORDER — FENTANYL CITRATE PF 50 MCG/ML IJ SOSY
50.0000 ug | PREFILLED_SYRINGE | Freq: Once | INTRAMUSCULAR | Status: AC
  Administered 2023-04-13: 50 ug via INTRAVENOUS
  Filled 2023-04-13: qty 1

## 2023-04-13 MED ORDER — IOHEXOL 300 MG/ML  SOLN
100.0000 mL | Freq: Once | INTRAMUSCULAR | Status: AC | PRN
  Administered 2023-04-13: 100 mL via INTRAVENOUS

## 2023-04-13 MED ORDER — PANTOPRAZOLE SODIUM 40 MG IV SOLR
40.0000 mg | Freq: Every day | INTRAVENOUS | Status: DC
  Administered 2023-04-13: 40 mg via INTRAVENOUS
  Filled 2023-04-13: qty 10

## 2023-04-13 MED ORDER — OXYCODONE HCL 5 MG PO TABS
5.0000 mg | ORAL_TABLET | ORAL | Status: DC | PRN
  Administered 2023-04-13 – 2023-04-14 (×2): 10 mg via ORAL
  Filled 2023-04-13 (×2): qty 2

## 2023-04-13 MED ORDER — METRONIDAZOLE 500 MG/100ML IV SOLN
500.0000 mg | Freq: Two times a day (BID) | INTRAVENOUS | Status: DC
  Administered 2023-04-13 – 2023-04-14 (×2): 500 mg via INTRAVENOUS
  Filled 2023-04-13 (×2): qty 100

## 2023-04-13 MED ORDER — HEPARIN SODIUM (PORCINE) 5000 UNIT/ML IJ SOLN
5000.0000 [IU] | Freq: Three times a day (TID) | INTRAMUSCULAR | Status: DC
  Administered 2023-04-14: 5000 [IU] via SUBCUTANEOUS
  Filled 2023-04-13: qty 1

## 2023-04-13 MED ORDER — ZOLPIDEM TARTRATE 5 MG PO TABS: 5.0000 mg | ORAL_TABLET | Freq: Every evening | ORAL | Status: DC | PRN

## 2023-04-13 MED ORDER — DIPHENHYDRAMINE HCL 50 MG/ML IJ SOLN: 12.5000 mg | Freq: Four times a day (QID) | INTRAMUSCULAR | Status: DC | PRN

## 2023-04-13 MED ORDER — MORPHINE SULFATE (PF) 4 MG/ML IV SOLN
4.0000 mg | Freq: Once | INTRAVENOUS | Status: AC
  Administered 2023-04-13: 4 mg via INTRAVENOUS
  Filled 2023-04-13: qty 1

## 2023-04-13 MED ORDER — ONDANSETRON HCL 4 MG/2ML IJ SOLN
4.0000 mg | Freq: Once | INTRAMUSCULAR | Status: AC
  Administered 2023-04-13: 4 mg via INTRAVENOUS
  Filled 2023-04-13: qty 2

## 2023-04-13 MED ORDER — MORPHINE SULFATE (PF) 2 MG/ML IV SOLN: 2.0000 mg | INTRAVENOUS | Status: DC | PRN

## 2023-04-13 NOTE — ED Provider Notes (Signed)
[facility] Provider Note    Event Date/Time   First MD Initiated Contact with Patient 04/13/23 1845     (approximate)   History   Abdominal Pain, Emesis, and Near Syncope   HPI  Jessica Rhodes is a 32 y.o. female who presents to the emergency department today because of concerns for abdominal pain.  Patient states that it started today.  Initially located in the upper abdomen and has now spread and is down to her right back as well as right lower quadrant.  Pain is severe.  She has had nausea and vomiting.  She denies any diarrhea.  Denies any change in urination.  Denies similar pain in the past.  No unusual ingestions recently.     Physical Exam   Triage Vital Signs: ED Triage Vitals [04/13/23 1754]  Encounter Vitals Group     BP 116/79     Systolic BP Percentile      Diastolic BP Percentile      Pulse Rate 71     Resp 20     Temp 99.1 F (37.3 C)     Temp Source Oral     SpO2 100 %     Weight 205 lb (93 kg)     Height 5\' 4"  (1.626 m)     Head Circumference      Peak Flow      Pain Score 10     Pain Loc      Pain Education      Exclude from Growth Chart     Most recent vital signs: Vitals:   04/13/23 1754 04/13/23 1832  BP: 116/79 118/76  Pulse: 71 64  Resp: 20 20  Temp: 99.1 F (37.3 C)   SpO2: 100% 100%   General: Awake, alert, oriented. CV:  Good peripheral perfusion. Regular rate and rhythm. Resp:  Normal effort. Lungs clear. Abd:  No distention.  Tender to palpation on the right side.  Abdomen is soft.  ED Results / Procedures / Treatments   Labs (all labs ordered are listed, but only abnormal results are displayed) Labs Reviewed  COMPREHENSIVE METABOLIC PANEL - Abnormal; Notable for the following components:      Result Value   CO2 20 (*)    Glucose, Bld 127 (*)    Creatinine, Ser 1.08 (*)    Total Protein 8.3 (*)    All other components within normal limits  CBC WITH DIFFERENTIAL/PLATELET - Abnormal; Notable  for the following components:   WBC 19.2 (*)    RBC 5.29 (*)    Neutro Abs 16.3 (*)    All other components within normal limits  LIPASE, BLOOD  URINALYSIS, ROUTINE W REFLEX MICROSCOPIC  POC URINE PREG, ED     EKG  I, Phineas Semen, attending physician, personally viewed and interpreted this EKG  EKG Time: 1847 Rate: 74 Rhythm: sinus rhythm Axis: normal Intervals: qtc 485 QRS: narrow ST changes: no st elevation Impression: normal ekg   RADIOLOGY I independently interpreted and visualized the CT abd/pel. My interpretation: appendicits Radiology interpretation: IMPRESSION:  Uncomplicated acute appendicitis.     PROCEDURES:  Critical Care performed: No    MEDICATIONS ORDERED IN ED: Medications  haloperidol lactate (HALDOL) injection 5 mg (has no administration in time range)  sodium chloride 0.9 % bolus 1,000 mL (1,000 mLs Intravenous New Bag/Given 04/13/23 1828)  fentaNYL (SUBLIMAZE) injection 50 mcg (50 mcg Intravenous Given 04/13/23 1817)  ondansetron (ZOFRAN) injection 4 mg (4 mg Intravenous  Given 04/13/23 1817)     IMPRESSION / MDM / ASSESSMENT AND PLAN / ED COURSE  I reviewed the triage vital signs and the nursing notes.                              Differential diagnosis includes, but is not limited to, appendicitis, gallbladder disease, pyelo, kidney stone  Patient's presentation is most consistent with acute presentation with potential threat to life or bodily function.   The patient is on the cardiac monitor to evaluate for evidence of arrhythmia and/or significant heart rate changes.  Patient presented to the emergency department today with's concern for severe abdominal pain.  On exam patient is tender in the abdomen primarily on the right side.  Blood work shows a significant leukocytosis.  CT abdomen pelvis was obtained.  This is consistent with uncomplicated appendicitis.  Discussed with Dr. Everlene Farrier with surgery who will plan on admission.  Discussed  findings and plan with patient.      FINAL CLINICAL IMPRESSION(S) / ED DIAGNOSES   Final diagnoses:  Appendicitis, unspecified appendicitis type        Note:  This document was prepared using Dragon voice recognition software and may include unintentional dictation errors.    Phineas Semen, MD 04/13/23 [phone]

## 2023-04-13 NOTE — ED Triage Notes (Addendum)
Pt arrives via POV from [facility] w/ c/o abd pain located in center of abd and RLQ that radiates into back that started around 1300. Pt also reporting nausea/vomiting, denies diarrhea. Pt states has near syncope on toilet at home. Pt denies chance of pregnancy. 3 months pp, denies complications with pregnancy or delivery. Pt tearful in triage, appears to be in distress.

## 2023-04-13 NOTE — ED Notes (Signed)
Patient crying out in pain.  C/O severe mid abdominal pain. Dr. Derrill Kay alerted and orders received.  Patient placed in the middle of triage on blood pressure and sat monitoring. SEe MAR

## 2023-04-14 ENCOUNTER — Observation Stay: Payer: BC Managed Care – PPO | Admitting: Anesthesiology

## 2023-04-14 ENCOUNTER — Encounter
Admission: EM | Disposition: A | Discharge status: Home / Self Care | Payer: Self-pay | Source: Home / Self Care | Attending: Emergency Medicine

## 2023-04-14 DIAGNOSIS — K37 Unspecified appendicitis: Secondary | ICD-10-CM | POA: Diagnosis not present

## 2023-04-14 DIAGNOSIS — K3580 Unspecified acute appendicitis: Secondary | ICD-10-CM | POA: Diagnosis not present

## 2023-04-14 HISTORY — PX: LAPAROSCOPIC APPENDECTOMY: SHX408

## 2023-04-14 LAB — CBC
HCT: 40.4 % (ref 36.0–46.0)
Hemoglobin: 13 g/dL (ref 12.0–15.0)
MCH: 26.6 pg (ref 26.0–34.0)
MCHC: 32.2 g/dL (ref 30.0–36.0)
MCV: 82.6 fL (ref 80.0–100.0)
Platelets: 315 10*3/uL (ref 150–400)
RBC: 4.89 MIL/uL (ref 3.87–5.11)
RDW: 12.7 % (ref 11.5–15.5)
WBC: 10.9 10*3/uL — ABNORMAL HIGH (ref 4.0–10.5)
nRBC: 0 % (ref 0.0–0.2)

## 2023-04-14 SURGERY — APPENDECTOMY, LAPAROSCOPIC
Anesthesia: General

## 2023-04-14 MED ORDER — SODIUM CHLORIDE 0.9 % IR SOLN
Status: DC | PRN
  Administered 2023-04-14: 1000 mL

## 2023-04-14 MED ORDER — MIDAZOLAM HCL 2 MG/2ML IJ SOLN
INTRAMUSCULAR | Status: DC | PRN
  Administered 2023-04-14: 2 mg via INTRAVENOUS

## 2023-04-14 MED ORDER — LACTATED RINGERS IV SOLN: INTRAVENOUS | Status: DC

## 2023-04-14 MED ORDER — KETOROLAC TROMETHAMINE 30 MG/ML IJ SOLN
INTRAMUSCULAR | Status: AC
  Filled 2023-04-14: qty 1

## 2023-04-14 MED ORDER — 0.9 % SODIUM CHLORIDE (POUR BTL) OPTIME
TOPICAL | Status: DC | PRN
  Administered 2023-04-14: 500 mL

## 2023-04-14 MED ORDER — PROPOFOL 10 MG/ML IV BOLUS
INTRAVENOUS | Status: AC
  Filled 2023-04-14: qty 20

## 2023-04-14 MED ORDER — DEXAMETHASONE SODIUM PHOSPHATE 10 MG/ML IJ SOLN
INTRAMUSCULAR | Status: AC
  Filled 2023-04-14: qty 1

## 2023-04-14 MED ORDER — LACTATED RINGERS IV SOLN: INTRAVENOUS | Status: DC | PRN

## 2023-04-14 MED ORDER — LIDOCAINE HCL (CARDIAC) PF 100 MG/5ML IV SOSY
PREFILLED_SYRINGE | INTRAVENOUS | Status: DC | PRN
  Administered 2023-04-14: 100 mg via INTRAVENOUS

## 2023-04-14 MED ORDER — EPHEDRINE 5 MG/ML INJ
INTRAVENOUS | Status: AC
  Filled 2023-04-14: qty 5

## 2023-04-14 MED ORDER — ORAL CARE MOUTH RINSE: 15.0000 mL | Freq: Once | OROMUCOSAL | Status: DC

## 2023-04-14 MED ORDER — ROCURONIUM BROMIDE 100 MG/10ML IV SOLN
INTRAVENOUS | Status: DC | PRN
  Administered 2023-04-14: 50 mg via INTRAVENOUS

## 2023-04-14 MED ORDER — MIDAZOLAM HCL 2 MG/2ML IJ SOLN
INTRAMUSCULAR | Status: AC
  Filled 2023-04-14: qty 2

## 2023-04-14 MED ORDER — BUPIVACAINE-EPINEPHRINE (PF) 0.25% -1:200000 IJ SOLN
INTRAMUSCULAR | Status: AC
  Filled 2023-04-14: qty 30

## 2023-04-14 MED ORDER — BUPIVACAINE-EPINEPHRINE (PF) 0.25% -1:200000 IJ SOLN
INTRAMUSCULAR | Status: DC | PRN
  Administered 2023-04-14: 50 mL via SUBCUTANEOUS

## 2023-04-14 MED ORDER — DEXAMETHASONE SODIUM PHOSPHATE 10 MG/ML IJ SOLN
INTRAMUSCULAR | Status: DC | PRN
  Administered 2023-04-14: 10 mg via INTRAVENOUS

## 2023-04-14 MED ORDER — BUPIVACAINE LIPOSOME 1.3 % IJ SUSP
INTRAMUSCULAR | Status: AC
  Filled 2023-04-14: qty 20

## 2023-04-14 MED ORDER — HYDROCODONE-ACETAMINOPHEN 5-325 MG PO TABS: 1.0000 | ORAL_TABLET | Freq: Four times a day (QID) | ORAL | 0 refills | Status: DC | PRN

## 2023-04-14 MED ORDER — ONDANSETRON HCL 4 MG/2ML IJ SOLN
INTRAMUSCULAR | Status: AC
  Filled 2023-04-14: qty 2

## 2023-04-14 MED ORDER — DEXMEDETOMIDINE HCL IN NACL 80 MCG/20ML IV SOLN
INTRAVENOUS | Status: DC | PRN
  Administered 2023-04-14: 8 ug via INTRAVENOUS

## 2023-04-14 MED ORDER — SUGAMMADEX SODIUM 200 MG/2ML IV SOLN
INTRAVENOUS | Status: DC | PRN
  Administered 2023-04-14: 200 mg via INTRAVENOUS

## 2023-04-14 MED ORDER — SODIUM CHLORIDE 0.9 % IV BOLUS: 1000.0000 mL | Freq: Once | INTRAVENOUS | Status: DC

## 2023-04-14 MED ORDER — KETOROLAC TROMETHAMINE 30 MG/ML IJ SOLN
INTRAMUSCULAR | Status: DC | PRN
  Administered 2023-04-14: 30 mg via INTRAVENOUS

## 2023-04-14 MED ORDER — LIDOCAINE HCL (PF) 2 % IJ SOLN
INTRAMUSCULAR | Status: AC
  Filled 2023-04-14: qty 5

## 2023-04-14 MED ORDER — PROPOFOL 10 MG/ML IV BOLUS
INTRAVENOUS | Status: DC | PRN
Start: 2023-04-14 — End: 2023-04-14
  Administered 2023-04-14: 150 mg via INTRAVENOUS

## 2023-04-14 MED ORDER — CHLORHEXIDINE GLUCONATE 0.12 % MT SOLN: 15.0000 mL | Freq: Once | OROMUCOSAL | Status: DC

## 2023-04-14 MED ORDER — FENTANYL CITRATE PF 50 MCG/ML IJ SOSY: 50.0000 ug | PREFILLED_SYRINGE | INTRAMUSCULAR | Status: DC | PRN

## 2023-04-14 MED ORDER — ROCURONIUM BROMIDE 10 MG/ML (PF) SYRINGE
PREFILLED_SYRINGE | INTRAVENOUS | Status: AC
  Filled 2023-04-14: qty 10

## 2023-04-14 MED ORDER — FENTANYL CITRATE (PF) 100 MCG/2ML IJ SOLN
INTRAMUSCULAR | Status: DC | PRN
  Administered 2023-04-14 (×2): 50 ug via INTRAVENOUS

## 2023-04-14 MED ORDER — FENTANYL CITRATE (PF) 100 MCG/2ML IJ SOLN
INTRAMUSCULAR | Status: AC
  Filled 2023-04-14: qty 2

## 2023-04-14 MED ORDER — ONDANSETRON HCL 4 MG/2ML IJ SOLN
INTRAMUSCULAR | Status: DC | PRN
Start: 2023-04-14 — End: 2023-04-14
  Administered 2023-04-14: 4 mg via INTRAVENOUS

## 2023-04-14 SURGICAL SUPPLY — 41 items
ADH SKN CLS APL DERMABOND .7 (GAUZE/BANDAGES/DRESSINGS) ×1
BLADE CLIPPER SURG (BLADE) ×2 IMPLANT
CUTTER FLEX LINEAR 45M (STAPLE) ×2 IMPLANT
DERMABOND ADVANCED .7 DNX12 (GAUZE/BANDAGES/DRESSINGS) ×2 IMPLANT
ELECT CAUTERY BLADE 6.4 (BLADE) ×2 IMPLANT
ELECT CAUTERY BLADE TIP 2.5 (TIP) ×1
ELECT REM PT RETURN 9FT ADLT (ELECTROSURGICAL) ×1
ELECTRODE CAUTERY BLDE TIP 2.5 (TIP) ×2 IMPLANT
ELECTRODE REM PT RTRN 9FT ADLT (ELECTROSURGICAL) ×2 IMPLANT
GLOVE BIO SURGEON STRL SZ7 (GLOVE) ×2 IMPLANT
GOWN STRL REUS W/ TWL LRG LVL3 (GOWN DISPOSABLE) ×4 IMPLANT
GOWN STRL REUS W/TWL LRG LVL3 (GOWN DISPOSABLE) ×2
IRRIGATION STRYKERFLOW (MISCELLANEOUS) ×2 IMPLANT
IRRIGATOR STRYKERFLOW (MISCELLANEOUS) ×1
IV NS 1000ML (IV SOLUTION) ×1
IV NS 1000ML BAXH (IV SOLUTION) ×2 IMPLANT
MANIFOLD NEPTUNE II (INSTRUMENTS) ×2 IMPLANT
NDL HYPO 22X1.5 SAFETY MO (MISCELLANEOUS) ×2 IMPLANT
NEEDLE HYPO 22X1.5 SAFETY MO (MISCELLANEOUS) ×1 IMPLANT
NS IRRIG 500ML POUR BTL (IV SOLUTION) ×2 IMPLANT
PACK LAP CHOLECYSTECTOMY (MISCELLANEOUS) ×2 IMPLANT
PENCIL SMOKE EVACUATOR (MISCELLANEOUS) ×2 IMPLANT
RELOAD 45 VASCULAR/THIN (ENDOMECHANICALS) IMPLANT
RELOAD STAPLE 45 2.5 WHT GRN (ENDOMECHANICALS) IMPLANT
RELOAD STAPLE 45 3.5 BLU ETS (ENDOMECHANICALS) ×2 IMPLANT
RELOAD STAPLE TA45 3.5 REG BLU (ENDOMECHANICALS) ×1 IMPLANT
SCISSORS METZENBAUM CVD 33 (INSTRUMENTS) IMPLANT
SET TUBE SMOKE EVAC HIGH FLOW (TUBING) ×2 IMPLANT
SHEARS HARMONIC ACE PLUS 36CM (ENDOMECHANICALS) ×2 IMPLANT
SLEEVE Z-THREAD 5X100MM (TROCAR) ×2 IMPLANT
SPONGE T-LAP 18X18 ~~LOC~~+RFID (SPONGE) ×2 IMPLANT
SUT MNCRL AB 4-0 PS2 18 (SUTURE) ×2 IMPLANT
SUT VICRYL 0 UR6 27IN ABS (SUTURE) ×4 IMPLANT
SYR 20ML LL LF (SYRINGE) ×2 IMPLANT
SYS BAG RETRIEVAL 10MM (BASKET) ×1
SYSTEM BAG RETRIEVAL 10MM (BASKET) ×2 IMPLANT
TRAP FLUID SMOKE EVACUATOR (MISCELLANEOUS) ×2 IMPLANT
TRAY FOLEY MTR SLVR 16FR STAT (SET/KITS/TRAYS/PACK) ×2 IMPLANT
TROCAR BALLN 12MMX100 BLUNT (TROCAR) ×2 IMPLANT
TROCAR Z-THREAD FIOS 5X100MM (TROCAR) ×2 IMPLANT
WATER STERILE IRR 500ML POUR (IV SOLUTION) ×2 IMPLANT

## 2023-04-14 NOTE — Anesthesia Procedure Notes (Signed)
Procedure Name: Intubation Date/Time: 04/14/2023 9:22 AM  Performed by: Karoline Caldwell, CRNAPre-anesthesia Checklist: Patient identified, Patient being monitored, Timeout performed, Emergency Drugs available and Suction available Patient Re-evaluated:Patient Re-evaluated prior to induction Oxygen Delivery Method: Circle system utilized Preoxygenation: Pre-oxygenation with 100% oxygen Induction Type: IV induction Ventilation: Mask ventilation without difficulty Laryngoscope Size: 3 and McGraph Grade View: Grade I Tube type: Oral Tube size: 7.0 mm Number of attempts: 1 Airway Equipment and Method: Stylet Placement Confirmation: ETT inserted through vocal cords under direct vision, positive ETCO2 and breath sounds checked- equal and bilateral Secured at: 21 cm Tube secured with: Tape Dental Injury: Teeth and Oropharynx as per pre-operative assessment

## 2023-04-14 NOTE — Op Note (Signed)
laparascopic appendectomy   Miguel Aschoff Hislop Date of operation:  04/14/2023  Indications: The patient presented with a history of  abdominal pain. Workup has revealed findings consistent with acute appendicitis.  Pre-operative Diagnosis: Acute appendicitis without mention of peritonitis  Post-operative Diagnosis: Same  Surgeon: Sterling Big, MD, FACS  Anesthesia: General with endotracheal tube  Findings: Acute non perforated appendicitis  Estimated Blood Loss: 5cc         Specimens: appendix         Complications:  none  Procedure Details  The patient was seen again in the preop area. The options of surgery versus observation were reviewed with the patient and/or family. The risks of bleeding, infection, recurrence of symptoms, negative laparoscopy, potential for an open procedure, bowel injury, abscess or infection, were all reviewed as well. The patient was taken to Operating Room, identified as Jessica Rhodes and the procedure verified as laparoscopic appendectomy. A Time Out was held and the above information confirmed.  The patient was placed in the supine position and general anesthesia was induced.  Antibiotic prophylaxis was administered and VT E prophylaxis was in place.   The abdomen was prepped and draped in a sterile fashion. An infraumbilical incision was made. A cutdown technique was used to enter the abdominal cavity. Two vicryl stitches were placed on the fascia and a Hasson trocar inserted. Pneumoperitoneum obtained. Two 5 mm ports were placed under direct visualization.   The appendix was identified and found to be acutely inflamed  The appendix was carefully dissected. The mesoappendix was divided withHarmonic scalpel. The base of the appendix was dissected out and divided with a standard load Endo GIA.The appendix was placed in a Endo Catch bag and removed via the Hasson port. The right lower quadrant and pelvis was then irrigated with  normal saline which was  aspirated. Inspection  failed to identify any additional bleeding and there were no signs of bowel injury. Again the right lower quadrant was inspected there was no sign of bleeding or bowel injury therefore pneumoperitoneum was released, all ports were removed.  The umbilical fascia was closed with 0 Vicryl interrupted sutures and the skin incisions were approximated with subcuticular 4-0 Monocryl. Dermabond was placed The patient tolerated the procedure well, there were no complications. The sponge lap and needle count were correct at the end of the procedure.  The patient was taken to the recovery room in stable condition to be admitted for continued care.    Sterling Big, MD FACS

## 2023-04-14 NOTE — Plan of Care (Signed)
Resolve. Adequate for discharge.  Cornell Barman 

## 2023-04-14 NOTE — Transfer of Care (Signed)
Immediate Anesthesia Transfer of Care Note  Patient: Jessica Rhodes  Procedure(s) Performed: APPENDECTOMY LAPAROSCOPIC  Patient Location: PACU  Anesthesia Type:General  Level of Consciousness: awake, alert , and oriented  Airway & Oxygen Therapy: Patient Spontanous Breathing  Post-op Assessment: Report given to RN and Post -op Vital signs reviewed and stable  Post vital signs: Reviewed and stable  Last Vitals:  Vitals Value Taken Time  BP    Temp    Pulse 99 04/14/23 1022  Resp 16 04/14/23 1022  SpO2 100 % 04/14/23 1022  Vitals shown include unfiled device data.  Last Pain:  Vitals:   04/14/23 0734  TempSrc:   PainSc: 3       Patients Stated Pain Goal: 0 (04/14/23 0734)  Complications: No notable events documented.

## 2023-04-14 NOTE — Anesthesia Preprocedure Evaluation (Addendum)
Anesthesia Evaluation  Patient identified by MRN, date of birth, ID band Patient awake    Reviewed: Allergy & Precautions, H&P , NPO status , Patient's Chart, lab work & pertinent test results  Airway Mallampati: II  TM Distance: >3 FB Neck ROM: Full    Dental no notable dental hx.    Pulmonary neg pulmonary ROS, former smoker   Pulmonary exam normal breath sounds clear to auscultation       Cardiovascular negative cardio ROS Normal cardiovascular exam Rhythm:Regular Rate:Normal     Neuro/Psych  PSYCHIATRIC DISORDERS Anxiety     negative neurological ROS  negative psych ROS   GI/Hepatic negative GI ROS, Neg liver ROS,,,  Endo/Other  negative endocrine ROSdiabetes    Renal/GU negative Renal ROS  negative genitourinary   Musculoskeletal negative musculoskeletal ROS (+)    Abdominal   Peds negative pediatric ROS (+)  Hematology negative hematology ROS (+)   Anesthesia Other Findings Obesity, BMI 34.06 Negative HCG 04-13-23  Reproductive/Obstetrics negative OB ROS                             Anesthesia Physical Anesthesia Plan  ASA: 2  Anesthesia Plan: General ETT   Post-op Pain Management:    Induction: Intravenous  PONV Risk Score and Plan:   Airway Management Planned: Oral ETT  Additional Equipment:   Intra-op Plan:   Post-operative Plan: Extubation in OR  Informed Consent: I have reviewed the patients History and Physical, chart, labs and discussed the procedure including the risks, benefits and alternatives for the proposed anesthesia with the patient or authorized representative who has indicated his/her understanding and acceptance.     Dental Advisory Given  Plan Discussed with: Anesthesiologist, CRNA and Surgeon  Anesthesia Plan Comments: (Patient consented for risks of anesthesia including but not limited to:  - adverse reactions to medications - damage to  eyes, teeth, lips or other oral mucosa - nerve damage due to positioning  - sore throat or hoarseness - Damage to heart, brain, nerves, lungs, other parts of body or loss of life  Patient voiced understanding.)        Anesthesia Quick Evaluation

## 2023-04-14 NOTE — Discharge Instructions (Signed)
Laparoscopic Appendectomy, Adult  A laparoscopic appendectomy is a surgery to take out the appendix. The appendix is a finger-like structure that is attached to the large intestine. This procedure may be done to prevent an inflamed appendix from bursting (rupturing). It may also be done to treat the infection from an appendix that has ruptured. It is often done right after appendicitis is diagnosed. Appendicitis is inflammation of the appendix. In this surgery, your health care provider uses a thin, lighted tube with a camera (laparoscope) to take out the appendix through three small incisions. This is a minimally invasive surgery. It usually results in less pain, fewer problems, and a quicker recovery than surgery done through a large incision (open appendectomy). Tell a health care provider about: Any allergies you have. All medicines you are taking, including vitamins, herbs, eye drops, creams, and over-the-counter medicines. Any steroid use. This includes creams or steroids you take by mouth. Any problems you or family members have had with anesthetic medicines. Any bleeding problems you have. Any surgeries you have had. Any medical conditions you have. Whether you are pregnant or may be pregnant. What are the risks? Generally, this is a safe procedure. However, problems may occur, including: Infection. Bleeding. Damage to nearby structures or organs. Allergic reactions to medicines. A collection of pus (abscess). Blood clots in the legs. What happens before the procedure? When to stop eating and drinking Follow instructions from your health care provider about eating and drinking restrictions. You may be asked not to eat or drink as soon as the diagnosis of appendicitis is made. Medicines Ask your health care provider about: Changing or stopping your regular medicines. This is especially important if you are taking diabetes medicines or blood thinners. Taking medicines such as aspirin  and ibuprofen. These medicines can thin your blood. Do not take these medicines unless your health care provider tells you to take them. Taking over-the-counter medicines, vitamins, herbs, and supplements. General instructions If you will be going home right after the procedure, plan to have a responsible adult: Take you home from the hospital or clinic. You will not be allowed to drive. Care for you for the time you are told. Ask your health care provider: How your surgery site will be marked. What steps will be taken to help prevent infection. These steps may include: Removing hair at the surgery site. Washing skin with a germ-killing soap. Taking antibiotic medicine. What happens during the procedure?  An IV will be inserted into one of your veins. You will be given one or more of the following: A medicine to help you relax (sedative). A medicine to numb the area (local anesthetic). A medicine to make you fall asleep (general anesthetic). A thin, flexible tube (catheter) may be put into your bladder to drain urine. A tube may be passed through your nose or mouth and into your stomach (orogastric or nasogastric tube) to drain any stomach contents. Your surgeon will make three small incisions near your belly button (navel). A gas (carbon dioxide) will be used to fill your abdomen. The gas will make your abdomen expand. This helps the surgeon see clearly and gives him or her more room to work. A laparoscope will be passed through one of the incisions. Other surgical instruments will be passed through the other incisions to assist in surgery. The appendix will be located and removed through one of the incisions. The abdomen may be washed out to remove bacteria. The incisions will be closed with stitches (sutures),  staples, or adhesive strips. A bandage (dressing) may be used to cover the incisions. If a tube was inserted into your bladder or stomach, it will be removed. The procedure may  vary among health care providers and hospitals. What happens after the procedure? Your blood pressure, heart rate, breathing rate, and blood oxygen level will be monitored until you leave the hospital or clinic. You will be given medicines as needed to control pain and infection. If you were given a sedative during the procedure, it can affect you for several hours. Do not drive or operate machinery until your health care provider says that it is safe. If your appendix did not rupture, you may be able to go home the same day after your surgery. If your appendix ruptured: You will get antibiotic medicine through an IV line. You may be sent home with a temporary drain. Summary A laparoscopic appendectomy is a surgery to take out the appendix. The appendix is removed through three small incisions with the help of a thin, lighted tube that has a camera (laparoscope). This is a safe procedure, but there are some risks. Risks include bleeding, infection, allergic reaction to medicines, or damage to nearby organs. After the procedure, your blood pressure, heart rate, breathing rate, and blood oxygen level will be monitored until you leave the hospital or clinic. You will be given medicines as needed to control pain and infection. This information is not intended to replace advice given to you by your health care provider. Make sure you discuss any questions you have with your health care provider. Document Revised: 06/09/2021 Document Reviewed: 06/09/2021 Elsevier Patient Education  2024 [employer].

## 2023-04-14 NOTE — Progress Notes (Signed)
IV removed without complications. Discharged education completed. Patient dressed. Waiting for transport to bring patient to the medical mall exit to be discharged to care of family in stable condition.  Cornell Barman 

## 2023-04-14 NOTE — H&P (Signed)
Patient ID: Jessica Rhodes, female   DOB: May 24, 1991, 32 y.o.   MRN: [id number]  HPI Jessica Rhodes is a 32 y.o. female coming to the emergency room last night complaining of abdominal pain.  Patient reports that the pain started yesterday initially in the mid abdomen and now localized to the right lower quadrant.  The pain is moderate to severe intensity intermittent and sharp.  Radiation to the back.  She also reports some nausea and vomiting.  No fevers no chills.  She is able to perform more than 4 METS of activity without any shortness of breath or chest pain.  She had a normal vaginal delivery 3 months ago. She did have a CT scan of the abdomen pelvis that I personally reviewed showing evidence of acute appendicitis without perforation without free air or abscess. White count is 19,000 and BMP is normal. The prior surgery was a breast reduction surgery a few years ago which she did fine   HPI  Past Medical History:  Diagnosis Date   Allergy    Anxiety    Gestational diabetes     Past Surgical History:  Procedure Laterality Date   BREAST REDUCTION SURGERY Bilateral 05/05/2020   Procedure: MAMMARY REDUCTION  (BREAST);  Surgeon: Allena Napoleon, MD;  Location: [facility];  Service: Plastics;  Laterality: Bilateral;   TONSILECTOMY/ADENOIDECTOMY WITH MYRINGOTOMY     WISDOM TOOTH EXTRACTION  2016    Family History  Problem Relation Age of Onset   Cancer Paternal Grandmother    Heart disease Paternal Grandfather    Diabetes Paternal Grandfather     Social History Social History   Tobacco Use   Smoking status: Former    Current packs/day: 0.00    Types: Cigarettes    Quit date: 09/26/2019    Years since quitting: 3.5   Smokeless tobacco: Never  Vaping Use   Vaping status: Former   Quit date: 04/11/2022   Substances: Nicotine  Substance Use Topics   Alcohol use: Not Currently    Comment: socially   Drug use: No    No Known Allergies  Current  Facility-Administered Medications  Medication Dose Route Frequency Provider Last Rate Last Admin   0.9 %  sodium chloride infusion   Intravenous Continuous ,  F, MD 100 mL/hr at 04/13/23 2339 New Bag at 04/13/23 2339   acetaminophen (TYLENOL) tablet 1,000 mg  1,000 mg Oral Q6H ,  F, MD   1,000 mg at 04/13/23 2339   cefTRIAXone (ROCEPHIN) 2 g in sodium chloride 0.9 % 100 mL IVPB  2 g Intravenous Q24H ,  F, MD 200 mL/hr at 04/14/23 0107 2 g at 04/14/23 0107   And   metroNIDAZOLE (FLAGYL) IVPB 500 mg  500 mg Intravenous Q12H ,  F, MD 100 mL/hr at 04/13/23 2344 500 mg at 04/13/23 2344   diphenhydrAMINE (BENADRYL) 12.5 MG/5ML elixir 12.5 mg  12.5 mg Oral Q6H PRN ,  F, MD       Or   diphenhydrAMINE (BENADRYL) injection 12.5 mg  12.5 mg Intravenous Q6H PRN ,  F, MD       heparin injection 5,000 Units  5,000 Units Subcutaneous Q8H ,  F, MD   5,000 Units at 04/14/23 0646   ketorolac (TORADOL) 30 MG/ML injection 30 mg  30 mg Intravenous Q6H PRN ,  F, MD       morphine (PF) 2 MG/ML injection 2 mg  2 mg Intravenous Q3H PRN ,  F,  MD       ondansetron (ZOFRAN-ODT) disintegrating tablet 4 mg  4 mg Oral Q6H PRN ,  F, MD       Or   ondansetron (ZOFRAN) injection 4 mg  4 mg Intravenous Q6H PRN ,  F, MD       oxyCODONE (Oxy IR/ROXICODONE) immediate release tablet 5-10 mg  5-10 mg Oral Q4H PRN ,  F, MD   10 mg at 04/13/23 2200   pantoprazole (PROTONIX) injection 40 mg  40 mg Intravenous QHS , Cecelia Byars F, MD   40 mg at 04/13/23 2339   zolpidem (AMBIEN) tablet 5 mg  5 mg Oral QHS PRN Leafy Ro, MD         Review of Systems Full ROS  was asked and was negative except for the information on the HPI  Physical Exam Blood pressure 119/78, pulse 67, temperature 98.8 F (37.1 C), resp. rate 18, height 5\' 4"  (1.626 m), weight 90 kg, SpO2 98%, not currently breastfeeding. CONSTITUTIONAL:  NAD. EYES: Pupils are equal, round, , Sclera are non-icteric. EARS, NOSE, MOUTH AND THROAT: The oropharynx is clear. The oral mucosa is pink and moist. Hearing is intact to voice. LYMPH NODES:  Lymph nodes in the neck are normal. RESPIRATORY:  Lungs are clear. There is normal respiratory effort, with equal breath sounds bilaterally, and without pathologic use of accessory muscles. CARDIOVASCULAR: Heart is regular without murmurs, gallops, or rubs. GI: The abdomen is  soft , patient is tender  right lower quadrant with focal peritonitis.  There is no hepatosplenomegaly. There are normal bowel sounds GU: Rectal deferred.   MUSCULOSKELETAL: Normal muscle strength and tone. No cyanosis or edema.   SKIN: Turgor is good and there are no pathologic skin lesions or ulcers. NEUROLOGIC: Motor and sensation is grossly normal. Cranial nerves are grossly intact. PSYCH:  Oriented to person, place and time. Affect is normal.  Data Reviewed  I have personally reviewed the patient's imaging, laboratory findings and medical records.    Assessment/Plan 32 year old female with classic signs and symptoms consistent with acute appendicitis confirmed by CT.  Discussed with the patient in detail about her disease process and my recommendation to proceed with appendectomy.  We will go ahead and start IV antibiotics fluids and perform appendectomy this morning..  Discussed with her in detail The risks, benefits, complications, treatment options, and expected outcomes were discussed with the patient.  Also discussed continuing to the operating room for Laparoscopic Appendectomy.  The possibilities of  bleeding, recurrent infection, perforation of viscus, finding a normal appendix, the need for additional procedures, failure to diagnose a condition, conversion to open procedure and creating a complication requiring transfusion or further operations were discussed. The patient was given the opportunity to ask questions and  have them answered.  Patient would like to proceed with Laparoscopic Appendectomy and consent was obtained.   Sterling Big, MD FACS General Surgeon 04/14/2023, 8:36 AM

## 2023-04-14 NOTE — Discharge Summary (Signed)
  Patient ID: CORYNNE SCIBILIA MRN: [id number] DOB/AGE: 05/25/1991 32 y.o.  Admit date: 04/13/2023 Discharge date: 04/14/2023   Discharge Diagnoses:  Principal Problem:   Appendicitis   Procedures:lap appendectomy  Hospital Course:  32yo admitted with findings consistent with acute appendicitis, she was admitted to the hospital , resuscitated w fluids and antibiotics started. SHe  was taken promptly to the operating room for an uneventful laparoscopic appendectomy.  Patient did well, at  The time of discharge the patient was ambulating,  pain was controlled.  Her vital signs were stable and she was afebrile.   physical exam at discharge showed a pt  in no acute distress.  Awake and alert.  Abdomen: Soft incisions healing well without infection or peritonitis.  Extremities well-perfused and no edema.  Condition of the patient the time of discharge was stable    Disposition: Discharge disposition: 01-Home or Self Care       Discharge Instructions     Call MD for:  difficulty breathing, headache or visual disturbances   Complete by: As directed    Call MD for:  extreme fatigue   Complete by: As directed    Call MD for:  hives   Complete by: As directed    Call MD for:  persistant dizziness or light-headedness   Complete by: As directed    Call MD for:  persistant nausea and vomiting   Complete by: As directed    Call MD for:  redness, tenderness, or signs of infection (pain, swelling, redness, odor or green/yellow discharge around incision site)   Complete by: As directed    Call MD for:  severe uncontrolled pain   Complete by: As directed    Call MD for:  temperature >100.4   Complete by: As directed    Diet - low sodium heart healthy   Complete by: As directed    Discharge instructions   Complete by: As directed    Shower 48 hrs   Increase activity slowly   Complete by: As directed    Lifting restrictions   Complete by: As directed    20 lbs x 6 wks       Allergies as of 04/14/2023   No Known Allergies      Medication List     STOP taking these medications    amLODipine 5 MG tablet Commonly known as: NORVASC   cyclobenzaprine 5 MG tablet Commonly known as: FLEXERIL       TAKE these medications    escitalopram 20 MG tablet Commonly known as: LEXAPRO Take 20 mg by mouth daily.   HYDROcodone-acetaminophen 5-325 MG tablet Commonly known as: NORCO/VICODIN Take 1-2 tablets by mouth every 6 (six) hours as needed for moderate pain.        Follow-up Information     Donovan Kail, PA-C Follow up on 04/26/2023.   Specialty: Physician Assistant Contact information: [address] [place] [address] [phone]                  Sterling Big, MD FACS

## 2023-04-14 NOTE — Anesthesia Postprocedure Evaluation (Signed)
Anesthesia Post Note  Patient: Jessica Rhodes  Procedure(s) Performed: APPENDECTOMY LAPAROSCOPIC  Patient location during evaluation: PACU Anesthesia Type: General Level of consciousness: awake and alert Pain management: pain level controlled Vital Signs Assessment: post-procedure vital signs reviewed and stable Respiratory status: spontaneous breathing, nonlabored ventilation, respiratory function stable and patient connected to nasal cannula oxygen Cardiovascular status: blood pressure returned to baseline and stable Postop Assessment: no apparent nausea or vomiting Anesthetic complications: no   No notable events documented.   Last Vitals:  Vitals:   04/14/23 1129 04/14/23 1247  BP: 103/67 114/77  Pulse: 81 82  Resp: 16 18  Temp: 37 C   SpO2: 93% 92%    Last Pain:  Vitals:   04/14/23 1208  TempSrc:   PainSc: 6                   C 

## 2023-04-15 ENCOUNTER — Encounter: Payer: Self-pay | Admitting: Surgery

## 2023-04-15 ENCOUNTER — Other Ambulatory Visit: Payer: Self-pay

## 2023-05-25 DIAGNOSIS — Z23 Encounter for immunization: Secondary | ICD-10-CM | POA: Diagnosis not present

## 2023-05-25 DIAGNOSIS — F3341 Major depressive disorder, recurrent, in partial remission: Secondary | ICD-10-CM | POA: Diagnosis not present

## 2023-05-25 DIAGNOSIS — F909 Attention-deficit hyperactivity disorder, unspecified type: Secondary | ICD-10-CM | POA: Diagnosis not present

## 2023-05-25 DIAGNOSIS — F411 Generalized anxiety disorder: Secondary | ICD-10-CM | POA: Diagnosis not present

## 2023-07-11 DIAGNOSIS — H1033 Unspecified acute conjunctivitis, bilateral: Secondary | ICD-10-CM | POA: Diagnosis not present

## 2023-09-13 DIAGNOSIS — H60501 Unspecified acute noninfective otitis externa, right ear: Secondary | ICD-10-CM | POA: Diagnosis not present

## 2023-09-17 DIAGNOSIS — H60502 Unspecified acute noninfective otitis externa, left ear: Secondary | ICD-10-CM | POA: Diagnosis not present

## 2023-10-01 DIAGNOSIS — L2089 Other atopic dermatitis: Secondary | ICD-10-CM | POA: Diagnosis not present

## 2023-11-23 DIAGNOSIS — F411 Generalized anxiety disorder: Secondary | ICD-10-CM | POA: Diagnosis not present

## 2023-11-23 DIAGNOSIS — F909 Attention-deficit hyperactivity disorder, unspecified type: Secondary | ICD-10-CM | POA: Diagnosis not present

## 2024-03-25 DIAGNOSIS — Z0189 Encounter for other specified special examinations: Secondary | ICD-10-CM | POA: Diagnosis not present

## 2024-04-25 DIAGNOSIS — F3341 Major depressive disorder, recurrent, in partial remission: Secondary | ICD-10-CM | POA: Diagnosis not present

## 2024-04-25 DIAGNOSIS — F909 Attention-deficit hyperactivity disorder, unspecified type: Secondary | ICD-10-CM | POA: Diagnosis not present

## 2024-05-30 ENCOUNTER — Encounter: Payer: Self-pay | Admitting: Certified Nurse Midwife

## 2024-05-30 ENCOUNTER — Telehealth: Payer: Self-pay | Admitting: Certified Nurse Midwife

## 2024-05-30 NOTE — Telephone Encounter (Signed)
 Patient thinks she may be have a miscarriage because she passed large clots, and heavy bleeding. I informed her because the office closes at 12:00 pm today, she should probably go to the hospital. She asked if she could go to urgent care. I told her I wasn't sure. I informed her I would send this message to the clinical staff. She said she was either going to urgent care, or the hospital.

## 2024-05-30 NOTE — Telephone Encounter (Signed)
 RN spoke with pt about bleeding concerns.  Pt states she is [redacted] weeks pregnant and passed a blood clot the size of her palm last night and has continued to have vaginal bleeding.  She denies any abdominal pain.  Pt concerned about emergency medical bill and voiced she really wanted a different option other than going to [facility] at [facility].  Advised pt that this is the recommendation with her current situation, confirmed this advice with Dr. Lola.  She stated she may go to urgent care, advised pt that it is likely that urgent care may still refer her to MAU.  Reached out to pt previous provider, Vannie CNM who plans to call pt with further information.    [place], RN

## 2024-06-04 ENCOUNTER — Ambulatory Visit (INDEPENDENT_AMBULATORY_CARE_PROVIDER_SITE_OTHER)

## 2024-06-04 DIAGNOSIS — Z3201 Encounter for pregnancy test, result positive: Secondary | ICD-10-CM

## 2024-06-04 DIAGNOSIS — O3680X0 Pregnancy with inconclusive fetal viability, not applicable or unspecified: Secondary | ICD-10-CM

## 2024-06-04 DIAGNOSIS — Z3200 Encounter for pregnancy test, result unknown: Secondary | ICD-10-CM

## 2024-06-04 LAB — POCT PREGNANCY, URINE: Preg Test, Ur: POSITIVE — AB

## 2024-06-04 NOTE — Progress Notes (Signed)
 Patient came in to drop off UPT per physician request. UPT resulted positive. Per physician advised to schedule patient for a viability ultrasound in office. Attempted to call patient to schedule appointment at 1414, but will attempt again later. Called patient at 1642 to schedule US  appointment 06/09/24 Pt confirmed appointment.   Devon, RN

## 2024-06-05 ENCOUNTER — Other Ambulatory Visit: Payer: Self-pay

## 2024-06-05 ENCOUNTER — Ambulatory Visit

## 2024-06-05 ENCOUNTER — Ambulatory Visit: Attending: Obstetrics and Gynecology

## 2024-06-05 ENCOUNTER — Other Ambulatory Visit: Payer: Self-pay | Admitting: Obstetrics & Gynecology

## 2024-06-05 ENCOUNTER — Ambulatory Visit (HOSPITAL_BASED_OUTPATIENT_CLINIC_OR_DEPARTMENT_OTHER): Admitting: Obstetrics and Gynecology

## 2024-06-05 VITALS — BP 127/80 | HR 86

## 2024-06-05 DIAGNOSIS — O468X1 Other antepartum hemorrhage, first trimester: Secondary | ICD-10-CM | POA: Diagnosis not present

## 2024-06-05 DIAGNOSIS — Z3A08 8 weeks gestation of pregnancy: Secondary | ICD-10-CM | POA: Diagnosis not present

## 2024-06-05 DIAGNOSIS — Q5128 Other doubling of uterus, other specified: Secondary | ICD-10-CM | POA: Diagnosis not present

## 2024-06-05 DIAGNOSIS — O34591 Maternal care for other abnormalities of gravid uterus, first trimester: Secondary | ICD-10-CM

## 2024-06-05 DIAGNOSIS — O209 Hemorrhage in early pregnancy, unspecified: Secondary | ICD-10-CM | POA: Insufficient documentation

## 2024-06-05 DIAGNOSIS — O283 Abnormal ultrasonic finding on antenatal screening of mother: Secondary | ICD-10-CM

## 2024-06-05 DIAGNOSIS — Q513 Bicornate uterus: Secondary | ICD-10-CM | POA: Diagnosis not present

## 2024-06-05 DIAGNOSIS — O4591 Premature separation of placenta, unspecified, first trimester: Secondary | ICD-10-CM | POA: Diagnosis not present

## 2024-06-05 DIAGNOSIS — O3400 Maternal care for unspecified congenital malformation of uterus, unspecified trimester: Secondary | ICD-10-CM

## 2024-06-05 DIAGNOSIS — Z3A01 Less than 8 weeks gestation of pregnancy: Secondary | ICD-10-CM

## 2024-06-05 DIAGNOSIS — O418X10 Other specified disorders of amniotic fluid and membranes, first trimester, not applicable or unspecified: Secondary | ICD-10-CM

## 2024-06-05 DIAGNOSIS — O3401 Maternal care for unspecified congenital malformation of uterus, first trimester: Secondary | ICD-10-CM | POA: Insufficient documentation

## 2024-06-05 DIAGNOSIS — O3680X0 Pregnancy with inconclusive fetal viability, not applicable or unspecified: Secondary | ICD-10-CM | POA: Diagnosis not present

## 2024-06-05 NOTE — Progress Notes (Signed)
 Maternal-Fetal Medicine Consultation  Name: Jessica Rhodes  MRN: [id number]  GA: [id number] [redacted]w[redacted]d   Early pregnancy confirmed by urine pregnancy test. She had vaginal bleeding and passed a big clot about 5 days ago. She does not have abdominal pain now.  Patient had ultrasound at your office that confirmed an intrauterine pregnancy and the CRL measurement was consistent with 7-weeks' gestation.  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   Obstetric history is significant for a term vaginal delivery in April 2024 of a female infant weighing 6-11 at birth. Her pregnancy was complicated by gestational diabetes. Her son is in good health.  Ultrasound We performed transabdominal and transvaginal ultrasound to evaluate the pregnancy. We confirmed pregnancy in the left horn of the uterus. The CRL measurement is consistent with 7w 2d gestation and it is consistent with her LMP-established date. Good fetal heart activity is seen. A moderate-size subchorionic hematoma, measuring 3.1 x 1 x 3 cm, is seen. No additional gestational sac is seen. Rt horn had heterogenous material consistent with either blood or decidualization.  Single cervix is seen.  Impression: Uterus bicornis unicollis.  Uterine anomaly and early pregnancy bleeding I discussed the findings with help of ultrasound images and diagrams. Patient was able to see fetal heart activity. Presence of a large subchorionic hemorrhage is associated with increased risk of miscarriage. Patient was advised to come to the hospital if she experiences significant vaginal bleeding. Her history of term delivery is reassuring and the likelihood of cervical insufficiency or preterm delivery associated with uterine anomaly is lower.  Short Interpregnancy Interval It is defined as the time interval (18 to 24 months) between the end of previous pregnancy and the beginning of next pregnancy.  The impact of short pregnancy interval  on the outcome of subsequent pregnancy is uncertain. Some studies have shown that congenital anomalies, preterm delivery, and fetal growth restriction rates are increased in pregnancies with short interpregnancy interval.  However, it is not supported by other reports. Overall, we should expect good pregnancy outcomes if there are no other high-risk factors.  Patient is undecided about continuation of pregnancy and will contact her provider. She is aware of increased risk of spontaneous miscarriage.  Recommendations -Recommend first-trimester anatomy at 32- to 13-weeks' gestation.     Consultation including face-to-face (more than 50%) counseling 45 minutes.

## 2024-06-09 ENCOUNTER — Other Ambulatory Visit

## 2024-06-10 ENCOUNTER — Encounter (HOSPITAL_COMMUNITY): Payer: Self-pay | Admitting: Obstetrics and Gynecology

## 2024-06-10 ENCOUNTER — Inpatient Hospital Stay (HOSPITAL_COMMUNITY)
Admission: AD | Admit: 2024-06-10 | Discharge: 2024-06-10 | Disposition: A | Discharge status: Home / Self Care | Attending: Obstetrics and Gynecology | Admitting: Obstetrics and Gynecology

## 2024-06-10 DIAGNOSIS — Z3A08 8 weeks gestation of pregnancy: Secondary | ICD-10-CM | POA: Diagnosis not present

## 2024-06-10 DIAGNOSIS — O219 Vomiting of pregnancy, unspecified: Secondary | ICD-10-CM | POA: Insufficient documentation

## 2024-06-10 DIAGNOSIS — O208 Other hemorrhage in early pregnancy: Secondary | ICD-10-CM | POA: Diagnosis not present

## 2024-06-10 DIAGNOSIS — Z3491 Encounter for supervision of normal pregnancy, unspecified, first trimester: Secondary | ICD-10-CM

## 2024-06-10 DIAGNOSIS — O26859 Spotting complicating pregnancy, unspecified trimester: Secondary | ICD-10-CM

## 2024-06-10 LAB — COMPREHENSIVE METABOLIC PANEL WITH GFR
ALT: 21 U/L (ref 0–44)
AST: 19 U/L (ref 15–41)
Albumin: 3.7 g/dL (ref 3.5–5.0)
Alkaline Phosphatase: 85 U/L (ref 38–126)
Anion gap: 10 (ref 5–15)
BUN: 6 mg/dL (ref 6–20)
CO2: 22 mmol/L (ref 22–32)
Calcium: 8.8 mg/dL — ABNORMAL LOW (ref 8.9–10.3)
Chloride: 103 mmol/L (ref 98–111)
Creatinine, Ser: 0.85 mg/dL (ref 0.44–1.00)
GFR, Estimated: >60 mL/min (ref >60)
Glucose, Bld: 75 mg/dL (ref 70–99)
Potassium: 3.8 mmol/L (ref 3.5–5.1)
Sodium: 135 mmol/L (ref 135–145)
Total Bilirubin: 0.7 mg/dL (ref 0.0–1.2)
Total Protein: 7.1 g/dL (ref 6.5–8.1)

## 2024-06-10 LAB — URINALYSIS, ROUTINE W REFLEX MICROSCOPIC
Bilirubin Urine: NEGATIVE
Glucose, UA: NEGATIVE mg/dL
Ketones, ur: 80 mg/dL — AB
Nitrite: NEGATIVE
Protein, ur: 30 mg/dL — AB
Specific Gravity, Urine: 1.025 (ref 1.005–1.030)
pH: 7 (ref 5.0–8.0)

## 2024-06-10 LAB — CBC
HCT: 40.4 % (ref 36.0–46.0)
Hemoglobin: 13.5 g/dL (ref 12.0–15.0)
MCH: 28.5 pg (ref 26.0–34.0)
MCHC: 33.4 g/dL (ref 30.0–36.0)
MCV: 85.2 fL (ref 80.0–100.0)
Platelets: 337 K/uL (ref 150–400)
RBC: 4.74 MIL/uL (ref 3.87–5.11)
RDW: 13 % (ref 11.5–15.5)
WBC: 11.8 K/uL — ABNORMAL HIGH (ref 4.0–10.5)
nRBC: 0 % (ref 0.0–0.2)

## 2024-06-10 MED ORDER — METOCLOPRAMIDE HCL 5 MG/ML IJ SOLN
10.0000 mg | Freq: Once | INTRAMUSCULAR | Status: AC
  Administered 2024-06-10: 10 mg via INTRAVENOUS
  Filled 2024-06-10: qty 2

## 2024-06-10 MED ORDER — PANTOPRAZOLE SODIUM 40 MG IV SOLR
40.0000 mg | Freq: Once | INTRAVENOUS | Status: AC
  Administered 2024-06-10: 40 mg via INTRAVENOUS
  Filled 2024-06-10: qty 10

## 2024-06-10 MED ORDER — ONDANSETRON HCL 4 MG/2ML IJ SOLN
4.0000 mg | Freq: Once | INTRAMUSCULAR | Status: AC
  Administered 2024-06-10: 4 mg via INTRAVENOUS
  Filled 2024-06-10: qty 2

## 2024-06-10 MED ORDER — METOCLOPRAMIDE HCL 10 MG PO TABS: 10.0000 mg | ORAL_TABLET | Freq: Three times a day (TID) | ORAL | 0 refills | Status: AC

## 2024-06-10 MED ORDER — DOXYLAMINE-PYRIDOXINE 10-10 MG PO TBEC: 2.0000 | DELAYED_RELEASE_TABLET | Freq: Every day | ORAL | 5 refills | Status: AC

## 2024-06-10 MED ORDER — LACTATED RINGERS IV BOLUS
1000.0000 mL | Freq: Once | INTRAVENOUS | Status: AC
  Administered 2024-06-10: 1000 mL via INTRAVENOUS

## 2024-06-10 MED ORDER — ONDANSETRON 4 MG PO TBDP: 4.0000 mg | ORAL_TABLET | Freq: Four times a day (QID) | ORAL | 0 refills | Status: AC | PRN

## 2024-06-10 NOTE — MAU Note (Signed)
 Jessica Rhodes is a 33 y.o. at [redacted]w[redacted]d here in MAU reporting: (about 10 days ago, had some bleeding, passed a clot, - she thought she had a miscarriage.  Went to the dr, they did a viability scan, still had viable IUP , also had a large subchorionic hemorrhage.) Only had random spotting today. Abd is kind of tender.  Since then has been nauseated, but the last 3 days, has been unable to keep any food down.  Has been sleeping.  Gets dizzy when up.  Pretty much only keeping down water. No meds for the nausea.  Onset of complaint: worsening n/v the past 3 days Pain score: mod Vitals:   06/10/24 1704  BP: 127/83  Pulse: 81  Resp: 17  Temp: 98.7 F (37.1 C)  SpO2: 100%      Lab orders placed from triage:  urine

## 2024-06-10 NOTE — MAU Provider Note (Addendum)
 History     CSN: [id number]  Arrival date and time: 06/10/24 1642   Event Date/Time   First Provider Initiated Contact with Patient 06/10/24 2116      Chief Complaint  Patient presents with   Abdominal Pain   Vaginal Bleeding   Nausea   Emesis   Abdominal Pain Associated symptoms include nausea and vomiting. Pertinent negatives include no diarrhea, dysuria or fever.  Vaginal Bleeding Associated symptoms include abdominal pain, nausea and vomiting. Pertinent negatives include no back pain, chills, diarrhea, dysuria, fever, flank pain, rash or sore throat.  Emesis  Associated symptoms include abdominal pain. Pertinent negatives include no chest pain, chills, coughing, diarrhea, dizziness or fever.    33 y.o. G2P1001 [redacted]w[redacted]d here with complaints of nausea/vomiting. Had bleeding but went to MD today and had viable IUP. Here mostly for the nausea and vomiting. She was reassured by recent US . She has a known subchorionic hemorrhage. Nausea and vomiting worsening past 3 days. Unable to keep down food and only limited fluids. She reports abdominal pain associated with vomiting.   Denies LOF, contractions, vaginal discharge. Having mild abdominal pain. Mostly located upper abdomen.    OB History     Gravida  2   Para  1   Term  1   Preterm      AB      Living  1      SAB      IAB      Ectopic      Multiple  0   Live Births  1           Past Medical History:  Diagnosis Date   Allergy    Anxiety    Gestational diabetes     Past Surgical History:  Procedure Laterality Date   BREAST REDUCTION SURGERY Bilateral 05/05/2020   Procedure: MAMMARY REDUCTION  (BREAST);  Surgeon: Elisabeth Craig RAMAN, MD;  [facility]:  [facility];  Service: Plastics;  Laterality: Bilateral;   LAPAROSCOPIC APPENDECTOMY N/A 04/14/2023   Procedure: APPENDECTOMY LAPAROSCOPIC;  Surgeon: Jordis Laneta FALCON, MD;  Location: [facility];  Service: General;  Laterality: N/A;    TONSILECTOMY/ADENOIDECTOMY WITH MYRINGOTOMY     WISDOM TOOTH EXTRACTION  2016    Family History  Problem Relation Age of Onset   Cancer Paternal Grandmother    Heart disease Paternal Grandfather    Diabetes Paternal Grandfather     Social History   Tobacco Use   Smoking status: Former    Current packs/day: 0.00    Types: Cigarettes    Quit date: 09/26/2019    Years since quitting: 4.7   Smokeless tobacco: Never  Vaping Use   Vaping status: Former   Quit date: 04/11/2022   Substances: Nicotine  Substance Use Topics   Alcohol use: Not Currently    Comment: socially   Drug use: No    Allergies: No Known Allergies  Medications Prior to Admission  Medication Sig Dispense Refill Last Dose/Taking   buPROPion (WELLBUTRIN XL) 150 MG 24 hr tablet Take 150 mg by mouth daily.   06/10/2024   escitalopram  (LEXAPRO ) 20 MG tablet Take 20 mg by mouth daily.   06/09/2024   HYDROcodone -acetaminophen  (NORCO/VICODIN) 5-325 MG tablet Take 1-2 tablets by mouth every 6 (six) hours as needed for moderate pain. (Patient not taking: Reported on 06/05/2024) 20 tablet 0     Review of Systems  Constitutional:  Negative for chills and fever.  HENT:  Negative for congestion and sore throat.  Eyes:  Negative for pain and visual disturbance.  Respiratory:  Negative for cough, chest tightness and shortness of breath.   Cardiovascular:  Negative for chest pain.  Gastrointestinal:  Positive for abdominal pain, nausea and vomiting. Negative for diarrhea.  Endocrine: Negative for cold intolerance and heat intolerance.  Genitourinary:  Positive for vaginal bleeding. Negative for dysuria and flank pain.  Musculoskeletal:  Negative for back pain.  Skin:  Negative for rash.  Allergic/Immunologic: Negative for food allergies.  Neurological:  Negative for dizziness and light-headedness.  Psychiatric/Behavioral:  Negative for agitation.    Physical Exam   Blood pressure 127/83, pulse 81, temperature 98.7 F  (37.1 C), temperature source Oral, resp. rate 17, height 5' 4 (1.626 m), weight 93.4 kg, last menstrual period 04/12/2024, SpO2 100%, not currently breastfeeding.  Physical Exam Vitals and nursing note reviewed.  Constitutional:      General: She is not in acute distress.    Appearance: She is well-developed.  HENT:     Head: Normocephalic and atraumatic.  Eyes:     General: No scleral icterus.    Conjunctiva/sclera: Conjunctivae normal.  Cardiovascular:     Rate and Rhythm: Normal rate.  Pulmonary:     Effort: Pulmonary effort is normal.  Chest:     Chest wall: No tenderness.  Abdominal:     Palpations: Abdomen is soft.     Tenderness: There is no abdominal tenderness. There is no guarding or rebound.  Genitourinary:    Vagina: Normal.  Musculoskeletal:        General: Normal range of motion.     Cervical back: Normal range of motion and neck supple.  Skin:    General: Skin is warm and dry.     Findings: No rash.  Neurological:     Mental Status: She is alert and oriented to person, place, and time.     MAU Course  Procedures  MDM: high  This patient presents to the ED for concern of   Chief Complaint  Patient presents with   Abdominal Pain   Vaginal Bleeding   Nausea   Emesis     These complains involves an extensive number of treatment options, and is a complaint that carries with it a high risk of complications and morbidity.  The differential diagnosis for  1. Vomiting in pregnancy INCLUDES infectious causes (less likely due to lack of infectious sx like fever/chills and otherwise normal vital signs, most likely normal variant. No signs of profound dehydration on exam.   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  The pertinent results include:   Results for orders placed or performed during the hospital encounter of 06/10/24 (from the past 24 hours)  Urinalysis, Routine w reflex microscopic -Urine, Clean Catch     Status: Abnormal   Collection Time: 06/10/24  5:40 PM  Result Value Ref Range   Color, Urine AMBER (A) YELLOW   APPearance CLOUDY (A) CLEAR   Specific Gravity, Urine 1.025 1.005 - 1.030   pH 7.0 5.0 - 8.0   Glucose, UA NEGATIVE NEGATIVE mg/dL   Hgb urine dipstick MODERATE (A)  NEGATIVE   Bilirubin Urine NEGATIVE NEGATIVE   Ketones, ur 80 (A) NEGATIVE mg/dL   Protein, ur 30 (A) NEGATIVE mg/dL   Nitrite NEGATIVE NEGATIVE   Leukocytes,Ua TRACE (A) NEGATIVE   RBC / HPF 21-50 0 - 5 RBC/hpf   WBC, UA 0-5 0 - 5 WBC/hpf   Bacteria, UA RARE (A) NONE SEEN   Squamous Epithelial / HPF 21-50 0 - 5 /HPF   Mucus PRESENT   CBC     Status: Abnormal   Collection Time: 06/10/24  6:54 PM  Result Value Ref Range   WBC 11.8 (H) 4.0 - 10.5 K/uL   RBC 4.74 3.87 - 5.11 MIL/uL   Hemoglobin 13.5 12.0 - 15.0 g/dL   HCT 59.5 63.9 - 53.9 %   MCV 85.2 80.0 - 100.0 fL   MCH 28.5 26.0 - 34.0 pg   MCHC 33.4 30.0 - 36.0 g/dL   RDW 86.9 88.4 - 84.4 %   Platelets 337 150 - 400 K/uL   nRBC 0.0 0.0 - 0.2 %  Comprehensive metabolic panel     Status: Abnormal   Collection Time: 06/10/24  6:54 PM  Result Value Ref Range   Sodium 135 135 - 145 mmol/L   Potassium 3.8 3.5 - 5.1 mmol/L   Chloride 103 98 - 111 mmol/L   CO2 22 22 - 32 mmol/L   Glucose, Bld 75 70 - 99 mg/dL   BUN 6 6 - 20 mg/dL   Creatinine, Ser 9.14 0.44 - 1.00 mg/dL   Calcium 8.8 (L) 8.9 - 10.3 mg/dL   Total Protein 7.1 6.5 - 8.1 g/dL   Albumin 3.7 3.5 - 5.0 g/dL   AST 19 15 - 41 U/L   ALT 21 0 - 44 U/L   Alkaline Phosphatase 85 38 - 126 U/L   Total Bilirubin 0.7 0.0 - 1.2  mg/dL   GFR, Estimated >39 >39 mL/min   Anion gap 10 5 - 15    MAU Course: 9:29 PM improved with IV fluids zofran . Still with nausea. Will give reglan and protonix . Also offered ginger ale and patient accepted.   10:25 PM  Patient reassessed after reglan and protonix . She did ask for crackers which is reassuring and she tolerated these. Feels ready for discharge   After the interventions noted above, I reevaluated the patient and found that they have :improved  Dispostion: discharged   Assessment and Plan   1. Nausea and vomiting during pregnancy prior to [redacted] weeks gestation   2. Spotting in early pregnancy   3. Viable pregnancy in first trimester   4. [redacted] weeks gestation of pregnancy    Discharged home in stable condition New medication regimen started for nausea/vomiting and sent to pharmacy Reviewed return precautions   Future Appointments  Date Time Provider Department Center  06/11/2024  8:55 AM Vannie Cornell SAUNDERS, CNM [facility]    Suzen Octave [facility] 06/10/2024, 9:29 PM

## 2024-06-10 NOTE — Discharge Instructions (Addendum)
 You were seen for nausea and vomiting. You were treated with fluids and also medications to help nausea.    You were discharged on medications to help control your symptoms.   Control medications - Diclegis  per instructions - Reglan 10mg   three times per day  As needed medications -Zofran 

## 2024-06-11 ENCOUNTER — Ambulatory Visit: Payer: Self-pay | Admitting: Certified Nurse Midwife

## 2024-06-12 NOTE — Progress Notes (Signed)
 Canceled, pregnancy ended.
